# Patient Record
Sex: Male | Born: 1955
Health system: Southern US, Community
[De-identification: ages and names within clinical notes are randomized; demographics above are authoritative.]

## PROBLEM LIST (undated history)

## (undated) DIAGNOSIS — E7800 Pure hypercholesterolemia, unspecified: Secondary | ICD-10-CM

## (undated) DIAGNOSIS — K219 Gastro-esophageal reflux disease without esophagitis: Secondary | ICD-10-CM

## (undated) DIAGNOSIS — M1990 Unspecified osteoarthritis, unspecified site: Secondary | ICD-10-CM

## (undated) DIAGNOSIS — J302 Other seasonal allergic rhinitis: Secondary | ICD-10-CM

## (undated) DIAGNOSIS — T8859XA Other complications of anesthesia, initial encounter: Secondary | ICD-10-CM

## (undated) DIAGNOSIS — L0390 Cellulitis, unspecified: Secondary | ICD-10-CM

## (undated) DIAGNOSIS — Z9889 Other specified postprocedural states: Secondary | ICD-10-CM

## (undated) DIAGNOSIS — I10 Essential (primary) hypertension: Secondary | ICD-10-CM

## (undated) HISTORY — PX: CERVICAL FUSION: SHX112

## (undated) HISTORY — PX: KNEE ARTHROSCOPY: SUR90

## (undated) HISTORY — PX: SHOULDER ARTHROSCOPY: SHX128

## (undated) HISTORY — PX: OTHER SURGICAL HISTORY: SHX169

---

## 2001-01-20 ENCOUNTER — Ambulatory Visit (HOSPITAL_COMMUNITY): Admission: RE | Admit: 2001-01-20 | Discharge: 2001-01-20 | Payer: Self-pay | Admitting: General Surgery

## 2001-02-06 ENCOUNTER — Encounter: Payer: Self-pay | Admitting: Specialist

## 2001-02-06 ENCOUNTER — Ambulatory Visit (HOSPITAL_COMMUNITY): Admission: RE | Admit: 2001-02-06 | Discharge: 2001-02-06 | Payer: Self-pay | Admitting: Specialist

## 2001-03-24 ENCOUNTER — Encounter: Payer: Self-pay | Admitting: Specialist

## 2001-03-24 ENCOUNTER — Observation Stay (HOSPITAL_COMMUNITY): Admission: RE | Admit: 2001-03-24 | Discharge: 2001-03-24 | Payer: Self-pay | Admitting: Specialist

## 2003-04-23 ENCOUNTER — Encounter: Admission: RE | Admit: 2003-04-23 | Discharge: 2003-05-08 | Payer: Self-pay | Admitting: Nurse Practitioner

## 2004-03-23 ENCOUNTER — Observation Stay (HOSPITAL_COMMUNITY): Admission: AD | Admit: 2004-03-23 | Discharge: 2004-03-24 | Payer: Self-pay | Admitting: Family Medicine

## 2004-03-26 ENCOUNTER — Ambulatory Visit (HOSPITAL_COMMUNITY): Admission: RE | Admit: 2004-03-26 | Discharge: 2004-03-26 | Payer: Self-pay | Admitting: *Deleted

## 2004-03-27 ENCOUNTER — Inpatient Hospital Stay (HOSPITAL_BASED_OUTPATIENT_CLINIC_OR_DEPARTMENT_OTHER): Admission: RE | Admit: 2004-03-27 | Discharge: 2004-03-27 | Payer: Self-pay | Admitting: *Deleted

## 2004-04-10 ENCOUNTER — Ambulatory Visit: Payer: Self-pay | Admitting: *Deleted

## 2007-05-12 ENCOUNTER — Ambulatory Visit: Payer: Self-pay | Admitting: Internal Medicine

## 2007-05-12 ENCOUNTER — Ambulatory Visit (HOSPITAL_COMMUNITY): Admission: RE | Admit: 2007-05-12 | Discharge: 2007-05-12 | Payer: Self-pay | Admitting: Internal Medicine

## 2007-10-03 ENCOUNTER — Ambulatory Visit (HOSPITAL_COMMUNITY): Admission: RE | Admit: 2007-10-03 | Discharge: 2007-10-03 | Payer: Self-pay | Admitting: Family Medicine

## 2007-10-17 ENCOUNTER — Ambulatory Visit (HOSPITAL_COMMUNITY): Admission: RE | Admit: 2007-10-17 | Discharge: 2007-10-17 | Payer: Self-pay | Admitting: Family Medicine

## 2007-10-31 ENCOUNTER — Ambulatory Visit (HOSPITAL_COMMUNITY): Admission: RE | Admit: 2007-10-31 | Discharge: 2007-11-01 | Payer: Self-pay | Admitting: Neurosurgery

## 2009-12-13 IMAGING — CR DG CERVICAL SPINE COMPLETE 4+V
6 series · 6 of 6 positions shown · non-contrast
Comparison: None

CLINICAL DATA: Cervical pain

CERVICAL SPINE - COMPLETE 4+ VIEW

[view not recorded (1 of 6)]
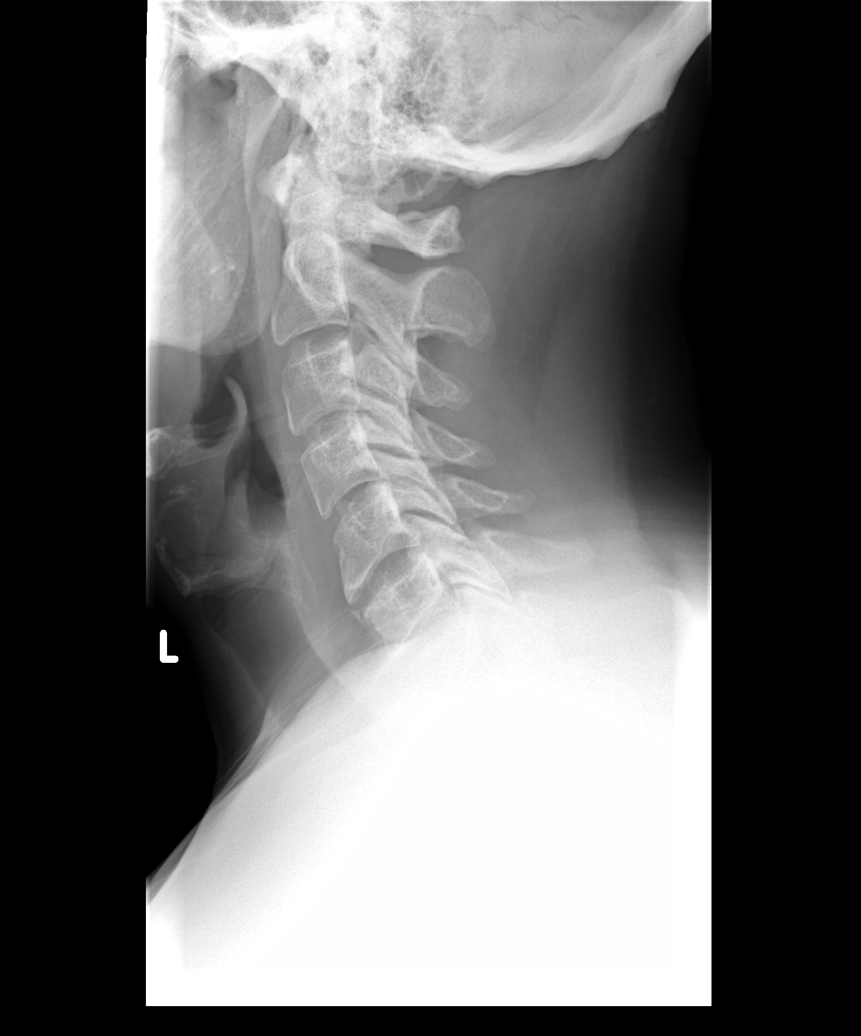

[view not recorded (2 of 6)]
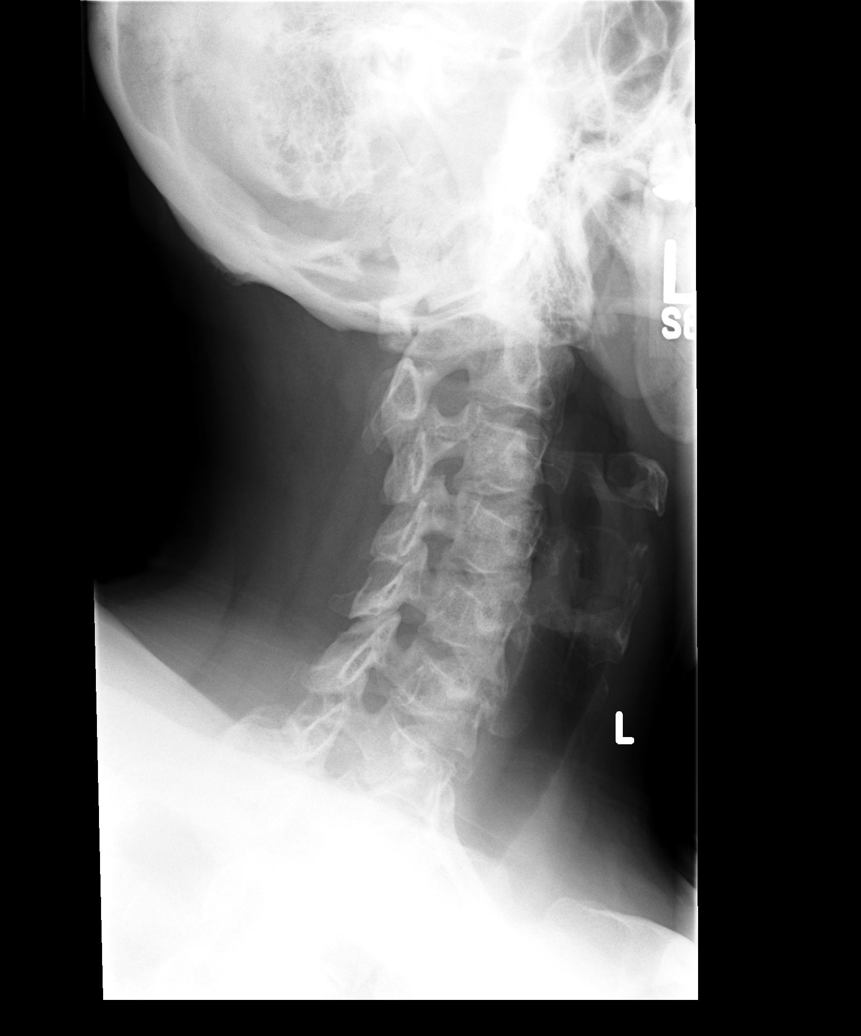

[view not recorded (3 of 6)]
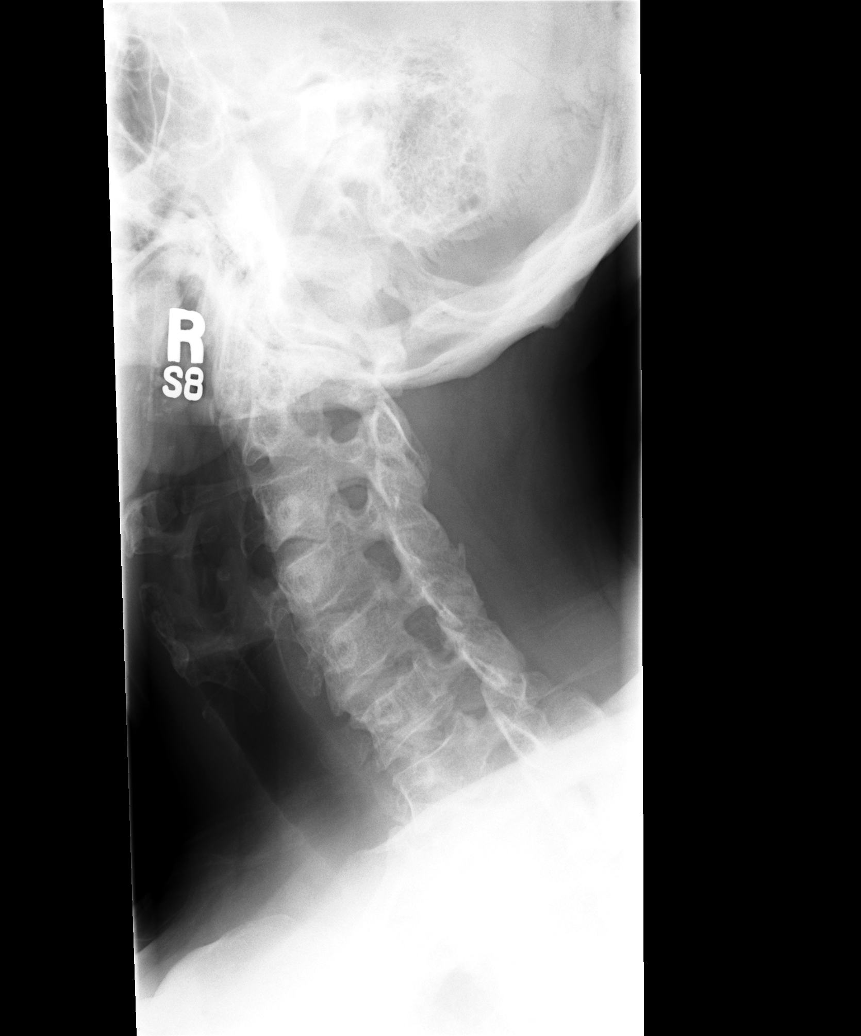

[view not recorded (4 of 6)]
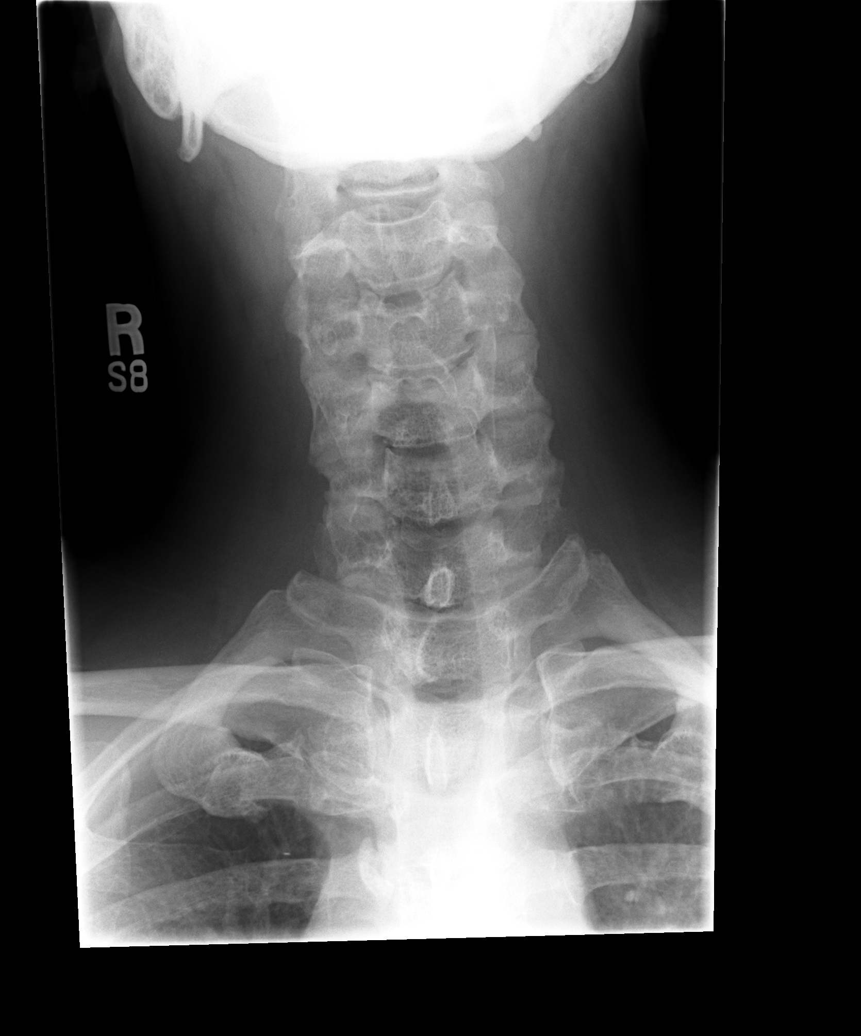

[view not recorded (5 of 6)]
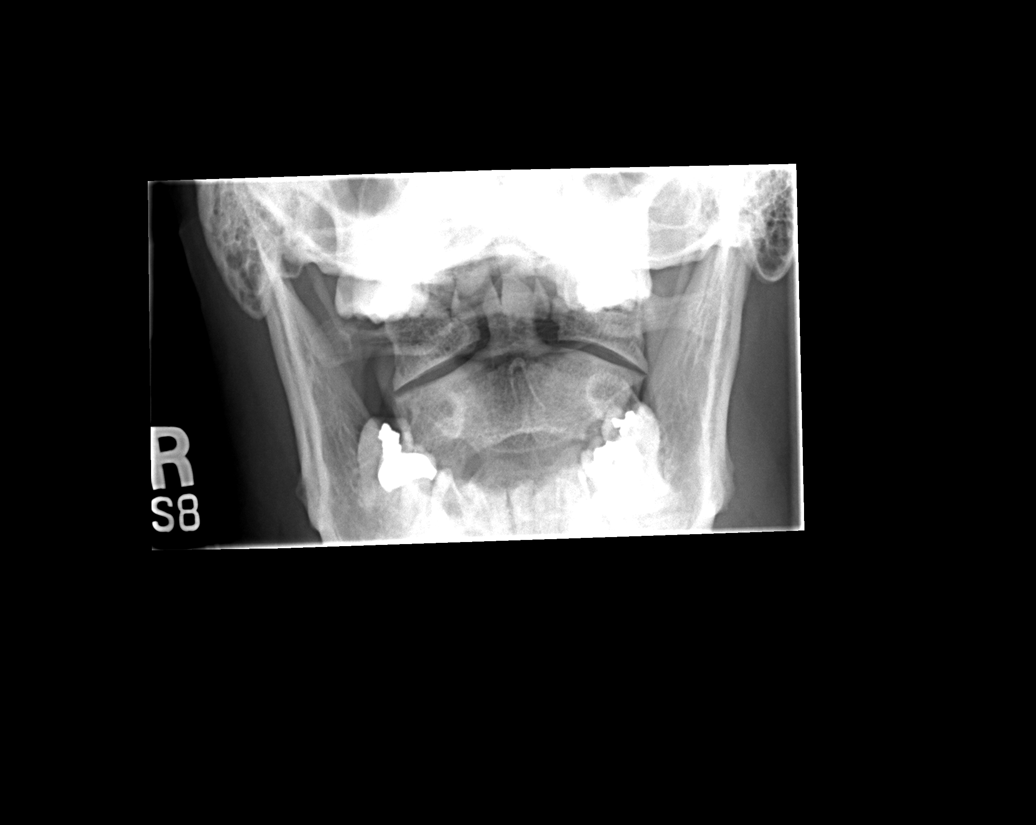

[view not recorded (6 of 6)]
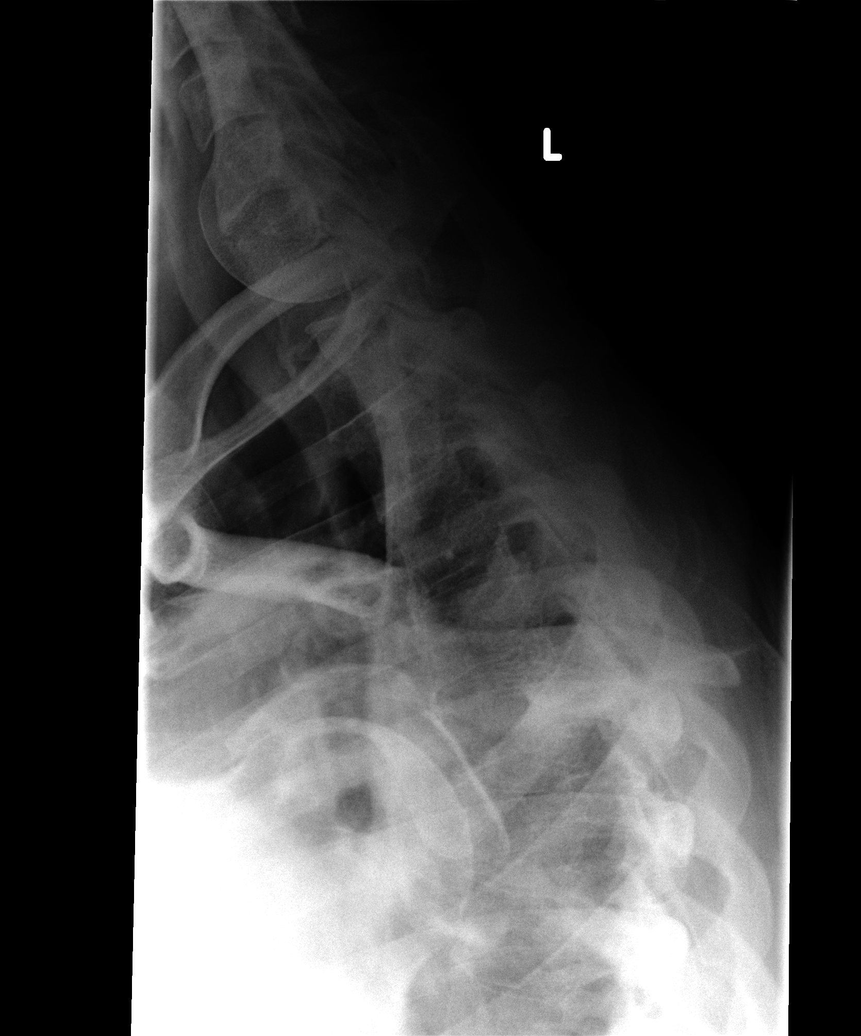

[6 of 6 positions shown; findings below may reference images not displayed]

FINDINGS: The alignment of the cervical spine is normal.

The prevertebral soft tissue space is normal.

There is disc space narrowing and mild spur formation at C5-6 and
C6-C7 consistent with degenerative disc disease.

On the left, there is neural foraminal narrowing involving the
approximate C3-4 and C5 levels.
IMPRESSION: 1.  No acute findings.
2.  Cervical spondylosis.
3.  Left-sided neural foraminal narrowing, which may account for
the patient's symptoms.  An MR of the cervical spine may provide
additional diagnostic information.

## 2010-10-13 NOTE — Op Note (Signed)
NAMEPATRIC, Andrew Manning             ACCOUNT NO.:  [id number]   MEDICAL RECORD NO.:  [id number]          PATIENT TYPE:  AMB   LOCATION:  DAY                           FACILITY:  [facility]   PHYSICIAN:  R. Roetta Sessions, M.D. DATE OF BIRTH:  1955-07-02   DATE OF PROCEDURE:  05/12/2007  DATE OF DISCHARGE:                               OPERATIVE REPORT   PROCEDURE:  Screening colonoscopy, ileocolonoscopy.   INDICATIONS FOR PROCEDURE:  A 55 year old gentleman sent over at the  courtesy of Dr. Lilyan Punt for colorectal cancer screening.  He is  devoid of any lower GI tract symptoms. He has never had his lower GI  tract imaged. There is no family history of colorectal neoplasia.  Colonoscopy is now being done as a screening maneuver.  This approach  has been discussed with the patient at length. The potential risks,  benefits and alternatives have been reviewed.  Please see documentation  in the medical record.   MONITORING:  O2 saturation, blood pressure, pulse and respirations were  monitored throughout the entire procedure.   CONSCIOUS SEDATION:  Versed 5 mg IV, Demerol 100 mg IV in divided doses.   INSTRUMENT:  Pentax video chip system.   FINDINGS:  Digital rectal exam revealed no abnormalities.   ENDOSCOPIC DIAGNOSIS:  The prep was good.   COLON:  The colonic mucosa was surveyed from the rectosigmoid junction  through the left transverse and right colon to the area of the  appendiceal orifice, ileocecal valve and cecum.  These structures were  well seen and photographed for the record.  The terminal ileum is  admitted to 15 cm easily. From this level, the scope was slowly and  cautiously withdrawn.  All previously mentioned mucosal surfaces were  again seen.  The patient had scattered sigmoid diverticulum and  colonic  mucosa, however appeared entirely normal as did the terminal ileum  mucosa. The scope was pulled down to the rectum where a thorough  examination of the rectal  mucosa including a retroflexed view of the  anal verge demonstrated no abnormalities.  The patient tolerated the  procedure well and was reacted in endoscopy.   IMPRESSION:  1. Normal rectum.  2. Sigmoid diverticulum and colonic mucosa, terminal ileum mucosa      appeared normal.   RECOMMENDATIONS:  1. Diverticulosis literature provided to Andrew Manning.  2. Consider repeat screening colonoscopy 10 years.      Jonathon Bellows, M.D.  Electronically Signed     RMR/MEDQ  D:  05/12/2007  T:  05/13/2007  Job:  [id number]   cc:   Lorin Picket A. Gerda Diss, MD  Fax: [phone]

## 2010-10-13 NOTE — Op Note (Signed)
NAME:  Andrew Manning, Andrew Manning NO.:  [id number]   MEDICAL RECORD NO.:  [id number]          PATIENT TYPE:  OIB   LOCATION:  3528                         FACILITY:  [facility]   PHYSICIAN:  Clydene Fake, M.D.  DATE OF BIRTH:  Aug 01, 1955   DATE OF PROCEDURE:  10/31/2007  DATE OF DISCHARGE:                               OPERATIVE REPORT   PREOPERATIVE DIAGNOSES:  __________   POSTOPERATIVE DIAGNOSES:  __________   REASON FOR PROCEDURE:  The patient is a 55 year old gentleman who has  been admitted with pain, numbness, and weakness.  __________ The patient  was brought in for decompression and fusion.   PROCEDURE IN DETAIL:  The patient was brought to the operating room and  general anesthesia was induced.  The patient was placed in 10-pound  Halter traction, prepped and draped in a sterile fashion.  Site of the  incision injected with 10 mL of 1% lidocaine with epinephrine.  Incision  was then made from the midline to the anterior border of the  sternocleidomastoid muscle on the left side.  Neck incision taken down  to the platysma, and hemostasis obtained with Bovie cauterization.  The  platysma was incised with the Bovie and blunt dissection taken through  the anterior cervical fascia to the anterior cervical spine.  A needle  was placed in interspace.  X-ray was obtained showing this was at C5-C6  interspace.  Disk space was incised with a #15 blade.  A partial  diskectomy was performed with pituitary rongeurs as the needle was  removed.  The longus colli muscles were reflected laterally from C5-C7  interspace and a self-retaining retractor was placed.  Disk spaces of C5-  C6 and C6-C7 were again incised with #15 blade and diskectomy started  with pituitary rongeurs and curettes.  Anterior osteophytes removed with  Kerrison punches.  Distraction pins were placed in the C5-C7, interspace  was distracted.  Microscope was brought in for microdissection at this  point and  curettes and 1- and 2-mm Kerrison punches were used to  continue the diskectomy, removing disk osteophytes and posterior  ligaments.  In this way, we decompressed the central canal and then did  bilateral foraminotomies at both the C5-C6 and C6-C7 levels, and at the  C6-C7 level, a large fragment of disk into the left side and compressing  on the nerve root, these were removed.  When we were finished, we had  good central and lateral decompression at both C5-C6 and C6-C7  bilaterally.  We placed a nerve hook over the nerve roots, and they were  well decompressed up the foramen.  Hemostasis was obtained with Gelfoam  and thrombin.  This was irrigated out.  We used high-speed drill to  remove cartilaginous endplate.  We measured the disk space to be 7 mm  and two 7-mm length allograft bones were tapped into place, countersunk  a couple of millimeters.  We checked posterior to the graft with the  nerve hook.  There was plenty of room between the bone graft and the  dura at both levels.  Distraction and distraction pins removed.  Hemostasis obtained with Gelfoam and thrombin.  Weight was removed from  the traction and the bone was firmly in place and a Trestle anterior  cervical plate was placed over the anterior cervical spine.  Two screws  were placed in C5, 2 in the C6, 2 in the C7, and these were tightened  down.  Lateral x-rays obtained showing good position of plate, screws,  bone plug at the C5 and C6 levels.  We could not see any lower due to  the shoulders.  Intraoperatively, he had good positioning of the bone  plates, screws, and bone plugs.  Retractors removed.  Hemostasis  obtained with bipolar cauterization and irrigated with antibiotic  solution.  We had good hemostasis, and the platysma was closed with 3-0  Vicryl interrupted sutures.  Subcutaneous tissue closed with the same.  Skin closed with benzoin and  Steri-Strips.  Dressing was placed.  The patient was placed in a soft   cervical collar, awoken from anesthesia and transferred to recovery room  in stable condition.           ______________________________  Clydene Fake, M.D.     JRH/MEDQ  D:  10/31/2007  T:  11/01/2007  Job:  [id number]

## 2010-10-16 NOTE — Cardiovascular Report (Signed)
NAMEBRYANT, Andrew Manning NO.:  [id number]   MEDICAL RECORD NO.:  [id number]          PATIENT TYPE:  OIB   LOCATION:  6501                         FACILITY:  [facility]   PHYSICIAN:  Carole Binning, M.D. LHCDATE OF BIRTH:  November 13, 1955   DATE OF PROCEDURE:  03/27/2004  DATE OF DISCHARGE:                              CARDIAC CATHETERIZATION   PROCEDURE PERFORMED:  Left heart catheterization with coronary angiography  and left ventriculography.   SURGEON:   INDICATIONS FOR PROCEDURE:  Mr. Carico is a 55 year old male who has had  two episodes of significant substernal chest pain.  A stress Cardiolite scan  performed in [place], [place], showed suggestion of inferior  perfusion defect with question of mild infarct with peri-infarct ischemia.  She was thus referred for cardiac catheterization.   DESCRIPTION OF PROCEDURE:  A 4 French sheath was placed in the right femoral  artery.  Coronary angiography was performed with standard Judkins 4 French  catheters.  Left ventriculography was performed with angled pigtail  catheter.  Contrast was Omnipaque.  There were no complications.   RESULTS:  Hemodynamics:  Left ventricular pressure 148/28.  Aortic pressure  148/94.  There is no aortic valve gradient.   Left ventriculogram:  Wall motion is normal.  Ejection fraction is estimated  at 60%.  There is no mitral regurgitation.   Coronary arteriography (right dominant):  1.  Left main is normal.  2.  Left anterior descending artery gives rise to a small first diagonal and      a normal sized second diagonal branch.  The LAD is normal.  3.  Left circumflex gives rise to three normal sized obtuse marginal      branches.  The left circumflex is normal.  4.  Right coronary artery is a large dominant vessel giving rise to a large      posterior descending artery, a small first posterolateral branch and a      large second posterolateral branch.  The right coronary  artery is      normal.   IMPRESSION:  1.  Normal left ventricular systolic function with moderate elevation of      left ventricular and end diastolic pressure.  2.  Normal coronary arteries.   RECOMMENDATIONS:  Medical therapy.       MWP/MEDQ  D:  03/27/2004  T:  03/27/2004  Job:  [id number]   cc:   Vida Roller, M.D.  Fax: [phone]   Scott A. Gerda Diss, MD  [address], [address]  [place]  [place] [address]  Fax: [phone]   Cardiac Cath Lab

## 2010-10-16 NOTE — Op Note (Signed)
[place]  Patient:    Andrew Manning, Andrew Manning Visit Number: [id number] MRN: [id number]          Service Type: SUR Location: 4W 0471 01 Attending Physician:  Pierce Crane Dictated by:   Javier Docker, M.D. Proc. Date: 03/24/01 Admit Date:  03/24/2001 Discharge Date: 03/24/2001                             Operative Report  PREOPERATIVE DIAGNOSIS:  Impingement syndrome left shoulder.  POSTOPERATIVE DIAGNOSIS:  Impingement syndrome left shoulder, anterior glenoid labral tear.  OPERATION:  Left shoulder arthroscopy, subacromial decompression, bursectomy, debridement of labral tear.  SURGEON:  Javier Docker, M.D.  INDICATIONS:  This is a 55 year old male with shoulder pain, positive impingement sign.  He received intermittent relief with subacromial corticosteroid injection.  She failed conservative management.  Operative management is indicated for subacromial decompression, bursectomy.  Risks and benefits have been discussed including wound infection, injury to vascular structures, no change in symptoms, need for repeat procedure.  DESCRIPTION OF PROCEDURE: The patient was placed in the supine beach chair position.  After the injection of adequate general anesthesia and 1 g of Kefzol, the left shoulder was prepped and draped in the usual sterile fashion.  A surgical marker was used to delineate the acromion, clavicle, coracoid.  An incision was made through the skin only in a standard posterior lateral portal of the acromion.  The traction was applied to the arm.  The cannula was introduced through the glenohumeral space towards the coracoid penetrating the capsule atraumatically.  The _____ was used to insufflate the joint. Examination revealed normal humeral head, glenoid and rotator cuff as well as biceps, however, there appeared to be either a Buford type complex or a significant anterior detachment of the anterior labrum.   It did not appear to be acute.  This was reduced with internal rotation and opened in external rotation.  Also, with internal rotation, there was a leading edge of the tear that evaginated between the glenoid and the humerus.  The anterior pole was then placed through the skin only.  A blunt cannula was introduced into the glenohumeral joint, shaver introduced and utilized to debride this labral tear.  It was felt that given this was atraumatic and to reattach this part of the labrum would require six or so tacks, it was felt not to proceed with repair.  He had no evidence of instability and it was mainly impingement. Following this debridement, there was no evagination of the labrum during internal and external rotation.  There was no instability noted anteriorly and posteriorly with intraoperative examination.  Next, the cannula was reintroduced into the subacromial space.  An anterior lateral portal was incised to the skin only and a cannula was introduced into the subacromial joint after a 0.25% Marcaine with epinephrine was infiltrated.  There was significant hypertrophic bursae noted and a bursectomy was performed with a 42 shaver.  There was mild lateral sloping of the anterior lateral aspect of the acromion.  The CA ligament was attached with the ArthroWand.  Full bursectomy was performed and inspection of the rotator cuff anteriorly, laterally and posteriorly revealed no evidence of a tear.  _____ was performed on the undersurface of the acromion anterior laterally to a good space following this.  Good range of motion of the shoulder without impingement.  There was no impingement beneath the clavicle noted as well.  After full bursectomy and inspection of the cuff, inspection revealed no evidence of residual impingement and no evidence of rotator cuff tear.  No active bleeding.  The CA ligament was morcellized.  Instrumentation was then removed and the portals were closed with  4-0 nylon sutures.  The wound was then dressed sterilely and placed in a sling.  The patient was extubated without difficulty and transferred to the recovery room in satisfactory condition. Dictated by:   Javier Docker, M.D. Attending Physician:  Pierce Crane DD:  03/24/01 TD:  03/27/01 Job: [id number]

## 2010-10-16 NOTE — H&P (Signed)
Andrew Manning, Andrew Manning             ACCOUNT NO.:  [id number]   MEDICAL RECORD NO.:  [id number]          PATIENT TYPE:  OBV   LOCATION:  A225                          FACILITY:  [facility]   PHYSICIAN:  Scott A. Gerda Diss, MD    DATE OF BIRTH:  Sep 12, 1955   DATE OF ADMISSION:  03/23/2004  DATE OF DISCHARGE:  LH                                HISTORY & PHYSICAL   CHIEF COMPLAINT:  Chest pressure and discomfort.   HISTORY OF PRESENT ILLNESS:  This 55 year old white male elated chest  pressure and discomfort on the left side of his chest, which radiates into  the shoulder and there is some discomfort down the arm.  He felt his skin  got a little clammy and felt a little nauseous with it.  The symptoms have  been present for the past three hours.  He denies any reflux symptoms, but  states some sharpness and soreness in the left side of the chest.  Denies  abdominal symptoms, nausea, vomiting, fever and chills.   PAST MEDICAL HISTORY:  1.  Hypertension.  2.  Low HDL.   SOCIAL HISTORY:  Does not smoke or drink.   FAMILY HISTORY:  Does have a family history of heart disease.   ALLERGIES:  None.   MEDICATIONS:  Cozaar 100 mg daily.   REVIEW OF SYSTEMS:  See per above.   PHYSICAL EXAMINATION:  GENERAL:  No acute distress.  HEENT:  TMS, nose and throat within normal limits.  NECK:  No masses.  CHEST:  Clear to auscultation.  Chest wall; moderate tenderness, left side  of chest.  HEART:  Regular.  ABDOMEN:  Abdomen is soft.  EXTREMITIES:  No edema.  GENERAL APPEARANCE:  Moderate obesity.   LABORATORY DATA:  EKG; no acute changes.   ASSESSMENT AND PLAN:  Chest pain.  This is concerning for the possibility of heart-related disease, but I think  the chest wall tenderness points in the direction of musculoskeletal.  Given  his symptoms and risk factors I do not feel this can viewed as an outpatient  problem.   1.  We will go ahead and admit him for observation.  2.  Do enzymes overnight  and if those are negative we will probably gear      towards doing a Cardiolite test on the patient on Tuesday.     Scot   SAL/MEDQ  D:  03/23/2004  T:  03/24/2004  Job:  [id number]

## 2010-10-16 NOTE — H&P (Signed)
[facility]  Patient:    Andrew Manning, Andrew Manning Visit Number: [id number] MRN: [id number]          Service Type: Attending:  Javier Docker, M.D. Dictated by:   Sammuel Cooper. Mahar, P.A. Adm. Date:  03/24/01                           History and Physical  DATE OF BIRTH:  05/17/1956  CHIEF COMPLAINT:  Left shoulder pain.  HISTORY OF PRESENT ILLNESS:  The patient is a 55 year old male who has been experiencing problems and difficulties with his left shoulder for approximately six months.  He has been seen by Dr. Jene Every.  He has failed conservative management including steroid injections, NSAIDs, and rest. MRI scan done on February 07, 2001, revealed no evidence of rotator cuff tear, mild acromioclavicular degenerative arthropathy, mild laterally downsloping acromion which can produce impingement.  Due to his failure to respond to conservative measures it was thought that the best course of action at this point would be a left shoulder arthroscopy with a subacromial decompression of possible open rotator cuff tear as needed.  The risks and benefits were discussed with the patient by Dr. Jene Every, and they decided to proceed with the surgery.  ALLERGIES:  No known drug allergies.  MEDICATIONS: 1. Cozaar 100 mg q.d. 2. Glucosamine 1000 mg b.i.d. 3. Bextra 20 mg q.d.  PAST MEDICAL HISTORY:  Hypertension.  PAST SURGICAL HISTORY: 1. Appendectomy in 1978. 2. Knee arthroscopy in 1990. 3. ______ in 1999. 4. Superficial mass removed from his back in August 2002.  SOCIAL HISTORY:  The patient denies tobacco use, denies alcohol use.  He is married and has two children, ages 28 and 83.  He does work as a Consulting civil engineer, Youth worker.  FAMILY HISTORY:  Mother is deceased secondary to cancer.  Father deceased, cancer and hypertension.  REVIEW OF SYSTEMS:  Patient denies any fevers, chills, night sweats, or bleeding tendencies.  CNS:   Denies any blurred vision, double vision, seizure, headache, paralysis.  PULMONARY:  Denies shortness of breath, productive cough, hemoptysis.  CARDIOVASCULAR:  Denies chest pain, angina, orthopnea, or claudication.  GASTROINTESTINAL:  Denies nausea, vomiting, constipation, diarrhea, melena, or bloody stool.  GENITOURINARY:  Denies dysuria, hematuria, discharge.  MUSCULOSKELETAL:  Denies any ______ , numbness, or weakness in his extremities.  PHYSICAL EXAMINATION:  VITAL SIGNS:  Blood pressure 128/88, respirations 14 and unlabored, pulse 68 and regular.  GENERAL:  [age]-year-old male who is alert and oriented.  In no acute distress.  Well-nourished, well-groomed.  Appears his stated age.  He is very pleasant and cooperative to examination.  HEENT:  Head normocephalic, atraumatic.  Pupils are equal, round, and reactive to light.  Nares patent bilaterally.  Throat is clear, without any erythema or exudate.  NECK:  Soft and supple to palpation.  No bruits appreciated.  No thyromegaly noted.  CHEST:  Clear to auscultation bilaterally.  No rales, rhonchi, wheezes, or friction rub.  BREASTS:  Not pertinent, not performed.  HEART:  S1, S2, regular rate and rhythm.  No murmurs, gallops, or rubs appreciated.  ABDOMEN:  Soft and supple to palpation.  Positive bowel sounds throughout. Nontender, nondistended.  No organomegaly noted.  GENITOURINARY:  Not pertinent, not performed.  EXTREMITIES:  The patient has positive impingement sign to the left shoulder. Decreased range of motion secondary to pain.  Sensation is intact to light touch to bilateral upper extremities.  Neurovascularly intact.  Radial pulses are strong and equal bilaterally.  SKIN:  Intact, without any lesions or rashes.  LABORATORY DATA:  MRI of the left shoulder showed as stated above.  IMPRESSION: 1. Left shoulder impingement syndrome. 2. Hypertension.  PLAN:  Admit to [facility] on March 24, 2001, for a left shoulder arthroscopy with subacromial decompression and possible open rotator cuff tear repair as needed.  This will be done by Dr. Jene Every.  The patients primary care medical physician is Dr. Lilyan Punt.  The patient has an appointment with him at 3 oclock on Monday, March 20, 2001, for medical clearance.  This will be faxed to our office and forwarded on to the hospital upon receiving it. Dictated by:   Sammuel Cooper. Mahar, P.A. Attending:  Javier Docker, M.D. DD:  03/20/01 TD:  03/21/01 Job: [id number]

## 2010-10-16 NOTE — Procedures (Signed)
NAMEKELVON, GIANNINI             ACCOUNT NO.:  [id number]   MEDICAL RECORD NO.:  [id number]          PATIENT TYPE:  OBV   LOCATION:  A225                          FACILITY:  [facility]   PHYSICIAN:  Scott A. Gerda Diss, MD    DATE OF BIRTH:  1955-10-19   DATE OF PROCEDURE:  DATE OF DISCHARGE:                                    STRESS TEST   PROCEDURE:  Stress Cardiolite   Resting EKG:  No acute ST segment changes are noted.  Normal sinus rhythm.   PROTOCOL:  Bruce protocol with Cardiolite.   INDICATIONS:  Chest discomfort.   Heart rate response __________ patient's heart rate gradually went up,  peaked at 160 at the end of stage III.   SYMPTOMATOLOGY:  Mild shortness of breath, but no chest discomfort.   ARRHYTHMIAS:  None.   BLOOD PRESSURE RESPONSE TO EXERCISE:  Normal.   ST SEGMENT CHANGES:  None.   RECOVERY PHASE:  No ST segment changes noted.  Blood pressure gradually came  down.  Heart rate came down to normal level.  No abnormal symptomatology.   INTERPRETATION:  Normal stress test.  Await Cardiolite imaging.     Scot   SAL/MEDQ  D:  03/24/2004  T:  03/24/2004  Job:  [id number]

## 2011-02-25 LAB — BASIC METABOLIC PANEL
BUN: 15
CO2: 27
Calcium: 9.2
Chloride: 104
Creatinine, Ser: 0.91
GFR calc Af Amer: >60
GFR calc non Af Amer: >60
Glucose, Bld: 94
Potassium: 3.5
Sodium: 141

## 2011-02-25 LAB — APTT: aPTT: 29

## 2011-02-25 LAB — CBC
HCT: 46.5
Hemoglobin: 16.1
MCHC: 34.5
MCV: 89.6
Platelets: 265
RBC: 5.2
RDW: 13.1
WBC: 9.4

## 2011-02-25 LAB — URINALYSIS, ROUTINE W REFLEX MICROSCOPIC
Bilirubin Urine: NEGATIVE
Glucose, UA: NEGATIVE
Hgb urine dipstick: NEGATIVE
Ketones, ur: NEGATIVE
Nitrite: NEGATIVE
Protein, ur: NEGATIVE
Specific Gravity, Urine: 1.013
Urobilinogen, UA: 0.2
pH: 7

## 2011-02-25 LAB — PROTIME-INR
INR: 1
Prothrombin Time: 13.4

## 2011-05-24 ENCOUNTER — Emergency Department (HOSPITAL_COMMUNITY): Payer: Worker's Compensation

## 2011-05-24 ENCOUNTER — Emergency Department (HOSPITAL_COMMUNITY)
Admission: EM | Admit: 2011-05-24 | Discharge: 2011-05-24 | Disposition: A | Discharge status: Home / Self Care | Payer: Worker's Compensation | Attending: Emergency Medicine | Admitting: Emergency Medicine

## 2011-05-24 ENCOUNTER — Encounter: Payer: Self-pay | Admitting: *Deleted

## 2011-05-24 DIAGNOSIS — K219 Gastro-esophageal reflux disease without esophagitis: Secondary | ICD-10-CM | POA: Insufficient documentation

## 2011-05-24 DIAGNOSIS — IMO0002 Reserved for concepts with insufficient information to code with codable children: Secondary | ICD-10-CM | POA: Insufficient documentation

## 2011-05-24 DIAGNOSIS — I10 Essential (primary) hypertension: Secondary | ICD-10-CM | POA: Insufficient documentation

## 2011-05-24 DIAGNOSIS — L03119 Cellulitis of unspecified part of limb: Secondary | ICD-10-CM | POA: Insufficient documentation

## 2011-05-24 DIAGNOSIS — Z7982 Long term (current) use of aspirin: Secondary | ICD-10-CM | POA: Insufficient documentation

## 2011-05-24 DIAGNOSIS — L02419 Cutaneous abscess of limb, unspecified: Secondary | ICD-10-CM | POA: Insufficient documentation

## 2011-05-24 DIAGNOSIS — L0390 Cellulitis, unspecified: Secondary | ICD-10-CM

## 2011-05-24 DIAGNOSIS — W010XXA Fall on same level from slipping, tripping and stumbling without subsequent striking against object, initial encounter: Secondary | ICD-10-CM | POA: Insufficient documentation

## 2011-05-24 DIAGNOSIS — Z981 Arthrodesis status: Secondary | ICD-10-CM | POA: Insufficient documentation

## 2011-05-24 DIAGNOSIS — Y9269 Other specified industrial and construction area as the place of occurrence of the external cause: Secondary | ICD-10-CM | POA: Insufficient documentation

## 2011-05-24 HISTORY — DX: Gastro-esophageal reflux disease without esophagitis: K21.9

## 2011-05-24 HISTORY — DX: Essential (primary) hypertension: I10

## 2011-05-24 MED ORDER — VANCOMYCIN HCL IN DEXTROSE 1-5 GM/200ML-% IV SOLN
1000.0000 mg | Freq: Once | INTRAVENOUS | Status: AC
  Administered 2011-05-24: 1000 mg via INTRAVENOUS
  Filled 2011-05-24: qty 200

## 2011-05-24 MED ORDER — HYDROCODONE-ACETAMINOPHEN 5-325 MG PO TABS: 1.0000 | ORAL_TABLET | ORAL | Status: AC | PRN

## 2011-05-24 MED ORDER — DOXYCYCLINE HYCLATE 100 MG PO CAPS: 100.0000 mg | ORAL_CAPSULE | Freq: Two times a day (BID) | ORAL | Status: DC

## 2011-05-24 MED ORDER — DOXYCYCLINE HYCLATE 100 MG PO TABS
100.0000 mg | ORAL_TABLET | Freq: Once | ORAL | Status: AC
  Administered 2011-05-24: 100 mg via ORAL
  Filled 2011-05-24: qty 1

## 2011-05-24 NOTE — ED Provider Notes (Signed)
Medical screening examination/treatment/procedure(s) were conducted as a shared visit with non-physician practitioner(s) and myself.  I personally evaluated the patient during the encounter  Patient with right lower extremity leg area of cellulitis several of superficial skin lacerations and abrasions along the anterior part of the shin most likely the culprit for the cellulitis. In ED we'll give 1 dose of IV vancomycin will send the patient home on doxycycline. Return if not improving in 2 days or if worse before. No concern for deep vein thrombosis classical cellulitis.    Shelda Jakes, MD 05/24/11 [phone]

## 2011-05-24 NOTE — ED Notes (Signed)
Pt injured his right knee at work on Tuesday. States that he slipped and fell. Pt has 2 abrasions to his knee. Bruising and swelling noted to right knee and lower leg.

## 2011-05-24 NOTE — ED Provider Notes (Signed)
History     CSN: [id number]  Arrival date & time 05/24/11  4259   First MD Initiated Contact with Patient 05/24/11 1045      Chief Complaint  Patient presents with  . Knee Injury    (Consider location/radiation/quality/duration/timing/severity/associated sxs/prior treatment) Patient is a 55 y.o. male presenting with knee pain. The history is provided by the patient.  Knee Pain This is a new (He hit his right knee at work 7 days ago,  causing mild abrasions,  but a large hematorma which has since greatly resolved.   He has had increased redness and pain below the knee and anterior lower leg) problem. The current episode started in the past 7 days. The problem occurs constantly. The problem has been gradually worsening. Associated symptoms include arthralgias. Pertinent negatives include no abdominal pain, chest pain, congestion, fever, headaches, joint swelling, nausea, neck pain, numbness, rash, sore throat or weakness. Associated symptoms comments: He has had increased redness and swelling of his right lower leg.. The symptoms are aggravated by walking and standing (Palpation). He has tried ice and NSAIDs for the symptoms. The treatment provided no relief.    Past Medical History  Diagnosis Date  . Hypertension   . GERD (gastroesophageal reflux disease)     Past Surgical History  Procedure Date  . Cervical fusion   . Shoulder arthroscopy   . Knee arthroscopy     History reviewed. No pertinent family history.  History  Substance Use Topics  . Smoking status: Never Smoker   . Smokeless tobacco: Not on file  . Alcohol Use: No      Review of Systems  Constitutional: Negative for fever.  HENT: Negative for congestion, sore throat and neck pain.   Eyes: Negative.   Respiratory: Negative for chest tightness and shortness of breath.   Cardiovascular: Negative for chest pain.  Gastrointestinal: Negative for nausea and abdominal pain.  Genitourinary: Negative.     Musculoskeletal: Positive for arthralgias. Negative for joint swelling.  Skin: Positive for color change. Negative for rash and wound.  Neurological: Negative for dizziness, weakness, light-headedness, numbness and headaches.  Hematological: Negative.   Psychiatric/Behavioral: Negative.     Allergies  Review of patient's allergies indicates no known allergies.  Home Medications   Current Outpatient Rx  Name Route Sig Dispense Refill  . ASPIRIN EC 81 MG PO TBEC Oral Take 81 mg by mouth daily.      . ETODOLAC 400 MG PO TABS Oral Take 400 mg by mouth 2 (two) times daily.      Marland Kitchen GLUCOSAMINE-CHONDROITIN 500-400 MG PO TABS Oral Take 1 tablet by mouth 3 (three) times daily.      Marland Kitchen LORATADINE 10 MG PO TABS Oral Take 10 mg by mouth daily.      Marland Kitchen LOSARTAN POTASSIUM 100 MG PO TABS Oral Take 100 mg by mouth daily.      Marland Kitchen PANTOPRAZOLE SODIUM 40 MG PO TBEC Oral Take 40 mg by mouth daily.      Marland Kitchen DOXYCYCLINE HYCLATE 100 MG PO CAPS Oral Take 1 capsule (100 mg total) by mouth 2 (two) times daily. 20 capsule 0  . HYDROCODONE-ACETAMINOPHEN 5-325 MG PO TABS Oral Take 1 tablet by mouth every 4 (four) hours as needed for pain. 15 tablet 0    BP 144/82  Pulse 87  Temp(Src) 98.5 F (36.9 C) (Oral)  Resp 16  Ht 5\' 8"  (1.727 m)  Wt 260 lb (117.935 kg)  BMI 39.53 kg/m2  SpO2 96%  Physical Exam  Nursing note and vitals reviewed. Constitutional: He is oriented to person, place, and time. He appears well-developed and well-nourished.  HENT:  Head: Normocephalic and atraumatic.  Eyes: Conjunctivae are normal.  Neck: Normal range of motion.  Cardiovascular: Normal rate, regular rhythm, normal heart sounds and intact distal pulses.   Pulmonary/Chest: Effort normal and breath sounds normal. He has no wheezes.  Abdominal: Soft. Bowel sounds are normal. There is no tenderness.  Musculoskeletal: Normal range of motion.  Neurological: He is alert and oriented to person, place, and time.  Skin: Skin is warm  and dry. There is erythema.       Non pitting edema of anterior right lower leg from upper tibia to ankle,  Slightly tender,  Increased erythema.  Two small abrasions without drainage or fluctuance right upper anterior tibia,  Smaller abrasion lower anterior leg as well.    Psychiatric: He has a normal mood and affect.    ED Course  Procedures (including critical care time)  Labs Reviewed - No data to display Dg Knee Complete 4 Views Right  05/24/2011  *RADIOLOGY REPORT*  Clinical Data: Injury  RIGHT KNEE - COMPLETE 4+ VIEW  Comparison: None  Findings: There is mild diffuse soft tissue swelling.  No joint effusion.  Marginal spur formation and sharpening of the tibial spines noted consistent with moderate DJD.  No fractures or subluxations identified.  IMPRESSION:  1.  No acute bone findings. 2.  Degenerative joint disease.  Original Report Authenticated By: Rosealee Albee, M.D.     1. Cellulitis    Vancomycin 1 gram IV given.  Doxycycline.    MDM  Elevation,  Warm compresses.  Doxy.  Recheck in 2 days,  Unless worse.        Candis Musa, PA 05/24/11 1319

## 2011-05-26 ENCOUNTER — Encounter (HOSPITAL_COMMUNITY): Payer: Self-pay | Admitting: Emergency Medicine

## 2011-05-26 ENCOUNTER — Emergency Department (HOSPITAL_COMMUNITY)
Admission: EM | Admit: 2011-05-26 | Discharge: 2011-05-26 | Disposition: A | Discharge status: Home / Self Care | Payer: Worker's Compensation | Attending: Emergency Medicine | Admitting: Emergency Medicine

## 2011-05-26 DIAGNOSIS — L0390 Cellulitis, unspecified: Secondary | ICD-10-CM

## 2011-05-26 DIAGNOSIS — I10 Essential (primary) hypertension: Secondary | ICD-10-CM | POA: Insufficient documentation

## 2011-05-26 DIAGNOSIS — M79609 Pain in unspecified limb: Secondary | ICD-10-CM | POA: Insufficient documentation

## 2011-05-26 DIAGNOSIS — T148XXA Other injury of unspecified body region, initial encounter: Secondary | ICD-10-CM

## 2011-05-26 DIAGNOSIS — X58XXXA Exposure to other specified factors, initial encounter: Secondary | ICD-10-CM | POA: Insufficient documentation

## 2011-05-26 DIAGNOSIS — Z79899 Other long term (current) drug therapy: Secondary | ICD-10-CM | POA: Insufficient documentation

## 2011-05-26 DIAGNOSIS — Z9889 Other specified postprocedural states: Secondary | ICD-10-CM | POA: Insufficient documentation

## 2011-05-26 DIAGNOSIS — L03119 Cellulitis of unspecified part of limb: Secondary | ICD-10-CM | POA: Insufficient documentation

## 2011-05-26 DIAGNOSIS — Z7982 Long term (current) use of aspirin: Secondary | ICD-10-CM | POA: Insufficient documentation

## 2011-05-26 DIAGNOSIS — K219 Gastro-esophageal reflux disease without esophagitis: Secondary | ICD-10-CM | POA: Insufficient documentation

## 2011-05-26 DIAGNOSIS — R609 Edema, unspecified: Secondary | ICD-10-CM | POA: Insufficient documentation

## 2011-05-26 DIAGNOSIS — L02419 Cutaneous abscess of limb, unspecified: Secondary | ICD-10-CM | POA: Insufficient documentation

## 2011-05-26 HISTORY — DX: Cellulitis, unspecified: L03.90

## 2011-05-26 MED ORDER — VANCOMYCIN HCL IN DEXTROSE 1-5 GM/200ML-% IV SOLN: 1000.0000 mg | Freq: Once | INTRAVENOUS | Status: DC

## 2011-05-26 MED ORDER — VANCOMYCIN HCL IN DEXTROSE 1-5 GM/200ML-% IV SOLN
1000.0000 mg | Freq: Once | INTRAVENOUS | Status: AC
  Administered 2011-05-26: 1000 mg via INTRAVENOUS

## 2011-05-26 MED ORDER — VANCOMYCIN HCL IN DEXTROSE 1-5 GM/200ML-% IV SOLN
INTRAVENOUS | Status: AC
  Filled 2011-05-26: qty 200

## 2011-05-26 NOTE — ED Notes (Signed)
Patient c/o right leg pain. Patient treated here on 05/24/11 for cellulitis of right leg after being injured at work. Patient received 1 gram of vancomycin IV while here and given prescription for doxycycline. Patient redness and swelling getting worse. Patient now has bruising to right heel.

## 2011-05-26 NOTE — ED Provider Notes (Addendum)
This chart was scribed for [employer]. Colon Branch, MD by Williemae Natter. The patient was seen in room APA12/APA12 and the patient's care was started at 1:44 PM.  CSN: [id number]  Arrival date & time 05/26/11  1056   First MD Initiated Contact with Patient 05/26/11 1159      Chief Complaint  Patient presents with  . Leg Pain  . Recurrent Skin Infections    (Consider location/radiation/quality/duration/timing/severity/associated sxs/prior treatment) HPI Seen at 1:44 PM Andrew Manning is a 55 y.o. male who presents to the Emergency Department complaining of continued cellulitis in the right leg. Pt was injured at work  05/18/11 and developed a cellulitis. Treated in the ER 05/24/11 with vancomycin and sent home on doxycycline. Cellulitis  is worsening and spreading to a larger area of the lower right leg. Pt also reported bruising on the right heel that occurred at the time of the other injury as well.   PCP Dr. Gerda Diss Past Medical History  Diagnosis Date  . Hypertension   . GERD (gastroesophageal reflux disease)   . Cellulitis     Past Surgical History  Procedure Date  . Cervical fusion   . Shoulder arthroscopy   . Knee arthroscopy     Family History  Problem Relation Age of Onset  . Cancer Mother   . Cancer Father   . Hypertension Father   . Hypertension Sister   . Hypertension Brother     History  Substance Use Topics  . Smoking status: Never Smoker   . Smokeless tobacco: Never Used  . Alcohol Use: No      Review of Systems 10 Systems reviewed and are negative for acute change except as noted in the HPI.  Allergies  Review of patient's allergies indicates no known allergies.  Home Medications   Current Outpatient Rx  Name Route Sig Dispense Refill  . ASPIRIN EC 81 MG PO TBEC Oral Take 81 mg by mouth daily.      Marland Kitchen DOXYCYCLINE HYCLATE 100 MG PO CAPS Oral Take 1 capsule (100 mg total) by mouth 2 (two) times daily. 20 capsule 0  . ETODOLAC 400 MG PO TABS Oral  Take 400 mg by mouth 2 (two) times daily.      Marland Kitchen GLUCOSAMINE-CHONDROITIN 500-400 MG PO TABS Oral Take 1 tablet by mouth 3 (three) times daily.      Marland Kitchen HYDROCODONE-ACETAMINOPHEN 5-325 MG PO TABS Oral Take 1 tablet by mouth every 4 (four) hours as needed for pain. 15 tablet 0  . LORATADINE 10 MG PO TABS Oral Take 10 mg by mouth daily.      Marland Kitchen LOSARTAN POTASSIUM 100 MG PO TABS Oral Take 100 mg by mouth daily.      Marland Kitchen PANTOPRAZOLE SODIUM 40 MG PO TBEC Oral Take 40 mg by mouth daily.        BP 146/88  Pulse 75  Temp(Src) 97.7 F (36.5 C) (Oral)  Resp 16  Ht 5\' 8"  (1.727 m)  Wt 260 lb (117.935 kg)  BMI 39.53 kg/m2  SpO2 100%  Physical Exam  Nursing note and vitals reviewed. Constitutional: He is oriented to person, place, and time. He appears well-developed and well-nourished. No distress.  HENT:  Head: Normocephalic and atraumatic.  Eyes: Conjunctivae and EOM are normal. Pupils are equal, round, and reactive to light.  Neck: Normal range of motion. Neck supple.  Cardiovascular: Normal rate, regular rhythm and normal heart sounds.   Pulmonary/Chest: Effort normal and breath sounds normal.  Abdominal: Soft.  Bowel sounds are normal.  Musculoskeletal: Normal range of motion. He exhibits edema.       Right leg is larger than left   Neurological: He is alert and oriented to person, place, and time.  Skin: Skin is warm and dry.       Cellulitis extends from knee to ankle Evidence of new cellulitis extending around to the sides of the calf 3 healed abrasions that are scabbed  Psychiatric: He has a normal mood and affect. His behavior is normal.    ED Course  Procedures (including critical care time)  MDM  Patient with recent fall at work resulting in development of cellulitis to right leg. Failed oral antibiotics. Given vancomycin i the ER and arrangements made for OP IV antibiotics for 7 days. Follow up with his PCP. Pt stable in ED with no significant deterioration in condition.The  patient appears reasonably screened and/or stabilized for discharge and I doubt any other medical condition or other [facility] requiring further screening, evaluation, or treatment in the ED at this time prior to discharge.  I personally performed the services described in this documentation, which was scribed in my presence. The recorded information has been reviewed and considered.   MDM Reviewed: previous chart, nursing note and vitals         Nicoletta Dress. Colon Branch, MD 05/26/11 1526  Nicoletta Dress. Colon Branch, MD 05/26/11 1536

## 2011-05-26 NOTE — ED Notes (Signed)
PIV to left hand left in due instructed [facility] instructions, iv flushed with saline without difficulty, site wnl and dsg intact

## 2011-05-26 NOTE — ED Notes (Signed)
I spoke with Marcelino Duster from the [facility] to give an update on the pt; pt is to get a dose of Vancomycin here in ED and 1GM for the next 7 days; order has been faxed to the specialty [facility] at [phone]

## 2011-05-26 NOTE — ED Notes (Signed)
Patient here for cellulitis on 12/24, states that it has gotten worse and more painful

## 2011-05-27 ENCOUNTER — Encounter (HOSPITAL_COMMUNITY): Payer: Worker's Compensation | Attending: Emergency Medicine

## 2011-05-27 DIAGNOSIS — L0390 Cellulitis, unspecified: Secondary | ICD-10-CM

## 2011-05-27 DIAGNOSIS — L02419 Cutaneous abscess of limb, unspecified: Secondary | ICD-10-CM | POA: Insufficient documentation

## 2011-05-27 DIAGNOSIS — L03119 Cellulitis of unspecified part of limb: Secondary | ICD-10-CM | POA: Insufficient documentation

## 2011-05-27 MED ORDER — HEPARIN SOD (PORK) LOCK FLUSH 100 UNIT/ML IV SOLN
500.0000 [IU] | Freq: Once | INTRAVENOUS | Status: AC
  Administered 2011-05-27: 300 [IU] via INTRAVENOUS

## 2011-05-27 MED ORDER — SODIUM CHLORIDE 0.9 % IV SOLN
INTRAVENOUS | Status: DC
  Administered 2011-05-27: 10:00:00 via INTRAVENOUS

## 2011-05-27 MED ORDER — SODIUM CHLORIDE 0.9 % IJ SOLN
10.0000 mL | INTRAMUSCULAR | Status: DC | PRN
  Administered 2011-05-27: 10 mL via INTRAVENOUS

## 2011-05-27 MED ORDER — VANCOMYCIN HCL IN DEXTROSE 1-5 GM/200ML-% IV SOLN
1000.0000 mg | Freq: Once | INTRAVENOUS | Status: AC
  Administered 2011-05-27: 1000 mg via INTRAVENOUS
  Filled 2011-05-27: qty 200

## 2011-05-27 NOTE — Progress Notes (Signed)
Infusion complete, patient tolerated well.  IV flushed with 10ml NS and 300 units Heparin and wrapped in guaze.

## 2011-05-28 ENCOUNTER — Encounter (HOSPITAL_COMMUNITY): Payer: Worker's Compensation

## 2011-05-28 VITALS — BP 156/89 | HR 89 | Temp 97.9°F

## 2011-05-28 DIAGNOSIS — L0390 Cellulitis, unspecified: Secondary | ICD-10-CM

## 2011-05-28 MED ORDER — VANCOMYCIN HCL IN DEXTROSE 1-5 GM/200ML-% IV SOLN
1000.0000 mg | INTRAVENOUS | Status: DC
  Administered 2011-05-28: 1000 mg via INTRAVENOUS
  Filled 2011-05-28 (×3): qty 200

## 2011-05-28 MED ORDER — HEPARIN SOD (PORK) LOCK FLUSH 100 UNIT/ML IV SOLN
500.0000 [IU] | Freq: Once | INTRAVENOUS | Status: AC
  Administered 2011-05-28: 200 [IU] via INTRAVENOUS

## 2011-05-28 MED ORDER — SODIUM CHLORIDE 0.9 % IV SOLN
INTRAVENOUS | Status: DC
  Administered 2011-05-28: 11:00:00 via INTRAVENOUS

## 2011-05-28 MED ORDER — SODIUM CHLORIDE 0.9 % IJ SOLN
10.0000 mL | INTRAMUSCULAR | Status: DC | PRN
  Administered 2011-05-28: 10 mL via INTRAVENOUS

## 2011-05-28 NOTE — Progress Notes (Signed)
Tolerated infusion well. 

## 2011-05-29 ENCOUNTER — Encounter (HOSPITAL_COMMUNITY): Payer: Worker's Compensation

## 2011-05-29 DIAGNOSIS — L0390 Cellulitis, unspecified: Secondary | ICD-10-CM

## 2011-05-29 MED ORDER — SODIUM CHLORIDE 0.9 % IJ SOLN
10.0000 mL | INTRAMUSCULAR | Status: DC | PRN
  Administered 2011-05-29: 10 mL via INTRAVENOUS

## 2011-05-29 MED ORDER — SODIUM CHLORIDE 0.9 % IV SOLN
INTRAVENOUS | Status: DC
  Administered 2011-05-29: 08:00:00 via INTRAVENOUS

## 2011-05-29 MED ORDER — HEPARIN SOD (PORK) LOCK FLUSH 100 UNIT/ML IV SOLN: 500.0000 [IU] | Freq: Once | INTRAVENOUS | Status: DC

## 2011-05-29 MED ORDER — VANCOMYCIN HCL IN DEXTROSE 1-5 GM/200ML-% IV SOLN
1000.0000 mg | Freq: Once | INTRAVENOUS | Status: AC
  Administered 2011-05-29: 1000 mg via INTRAVENOUS

## 2011-05-29 NOTE — Progress Notes (Signed)
Infusion complete, patient tolerated well.   

## 2011-05-30 ENCOUNTER — Encounter (HOSPITAL_COMMUNITY): Payer: Worker's Compensation

## 2011-05-30 DIAGNOSIS — L0390 Cellulitis, unspecified: Secondary | ICD-10-CM

## 2011-05-30 MED ORDER — SODIUM CHLORIDE 0.9 % IJ SOLN
10.0000 mL | INTRAMUSCULAR | Status: DC | PRN
  Administered 2011-05-30 (×2): 10 mL via INTRAVENOUS

## 2011-05-30 MED ORDER — VANCOMYCIN HCL IN DEXTROSE 1-5 GM/200ML-% IV SOLN
1000.0000 mg | Freq: Once | INTRAVENOUS | Status: AC
  Administered 2011-05-30: 1000 mg via INTRAVENOUS

## 2011-05-30 MED ORDER — SODIUM CHLORIDE 0.9 % IV SOLN
INTRAVENOUS | Status: DC
  Administered 2011-05-30: 08:00:00 via INTRAVENOUS

## 2011-05-30 NOTE — Progress Notes (Signed)
Infusion complete, patient tolerated well.   

## 2011-05-31 ENCOUNTER — Encounter (HOSPITAL_COMMUNITY): Payer: Worker's Compensation

## 2011-05-31 ENCOUNTER — Encounter (HOSPITAL_BASED_OUTPATIENT_CLINIC_OR_DEPARTMENT_OTHER): Payer: Worker's Compensation

## 2011-05-31 VITALS — BP 153/92 | HR 72 | Temp 97.8°F

## 2011-05-31 DIAGNOSIS — L0390 Cellulitis, unspecified: Secondary | ICD-10-CM

## 2011-05-31 DIAGNOSIS — L0291 Cutaneous abscess, unspecified: Secondary | ICD-10-CM

## 2011-05-31 LAB — VANCOMYCIN, TROUGH: Vancomycin Tr: <5 ug/mL — ABNORMAL LOW (ref 10.0–20.0)

## 2011-05-31 MED ORDER — SODIUM CHLORIDE 0.9 % IJ SOLN
10.0000 mL | INTRAMUSCULAR | Status: DC | PRN
  Administered 2011-05-31: 10 mL via INTRAVENOUS

## 2011-05-31 MED ORDER — SODIUM CHLORIDE 0.9 % IJ SOLN
INTRAMUSCULAR | Status: AC
  Filled 2011-05-31: qty 10

## 2011-05-31 MED ORDER — SODIUM CHLORIDE 0.9 % IV SOLN
INTRAVENOUS | Status: DC
  Administered 2011-05-31: 09:00:00 via INTRAVENOUS

## 2011-05-31 MED ORDER — VANCOMYCIN HCL IN DEXTROSE 1-5 GM/200ML-% IV SOLN
1000.0000 mg | Freq: Once | INTRAVENOUS | Status: AC
  Administered 2011-05-31: 1000 mg via INTRAVENOUS
  Filled 2011-05-31: qty 200

## 2011-05-31 MED ORDER — HEPARIN SOD (PORK) LOCK FLUSH 100 UNIT/ML IV SOLN: 500.0000 [IU] | Freq: Once | INTRAVENOUS | Status: DC

## 2011-05-31 NOTE — Progress Notes (Signed)
Tolerated well

## 2011-05-31 NOTE — Progress Notes (Signed)
Labs drawn today for Vanc. trough

## 2011-06-01 ENCOUNTER — Encounter (HOSPITAL_COMMUNITY): Payer: Worker's Compensation | Attending: Emergency Medicine

## 2011-06-01 VITALS — BP 152/91 | HR 63 | Temp 98.5°F

## 2011-06-01 DIAGNOSIS — L0291 Cutaneous abscess, unspecified: Secondary | ICD-10-CM | POA: Insufficient documentation

## 2011-06-01 DIAGNOSIS — L0390 Cellulitis, unspecified: Secondary | ICD-10-CM

## 2011-06-01 MED ORDER — SODIUM CHLORIDE 0.9 % IV SOLN
Freq: Once | INTRAVENOUS | Status: AC
  Administered 2011-06-01: 500 mL via INTRAVENOUS

## 2011-06-01 MED ORDER — VANCOMYCIN HCL IN DEXTROSE 1-5 GM/200ML-% IV SOLN
1000.0000 mg | Freq: Once | INTRAVENOUS | Status: AC
  Administered 2011-06-01: 1000 mg via INTRAVENOUS
  Filled 2011-06-01: qty 200

## 2011-06-01 MED ORDER — HEPARIN SOD (PORK) LOCK FLUSH 100 UNIT/ML IV SOLN
200.0000 [IU] | Freq: Once | INTRAVENOUS | Status: AC
  Administered 2011-06-01: 200 [IU] via INTRAVENOUS

## 2011-06-01 MED ORDER — SODIUM CHLORIDE 0.9 % IJ SOLN
10.0000 mL | Freq: Once | INTRAMUSCULAR | Status: AC
  Administered 2011-06-01: 10 mL via INTRAVENOUS

## 2011-06-01 NOTE — Progress Notes (Signed)
Tolerated infusion well. Discussed lab findings of vancomycin trough and need for him to see Dr. Fonnie Birkenhead further treatment. He is trying to get in touch with his contact person at work to find out who he should see because of workmans comp.

## 2011-06-02 ENCOUNTER — Encounter (HOSPITAL_COMMUNITY): Payer: Worker's Compensation

## 2011-06-02 ENCOUNTER — Encounter (HOSPITAL_COMMUNITY): Payer: Self-pay | Admitting: *Deleted

## 2011-06-02 ENCOUNTER — Emergency Department (HOSPITAL_COMMUNITY)
Admission: EM | Admit: 2011-06-02 | Discharge: 2011-06-02 | Disposition: A | Discharge status: Home / Self Care | Payer: Worker's Compensation | Attending: Emergency Medicine | Admitting: Emergency Medicine

## 2011-06-02 DIAGNOSIS — L0390 Cellulitis, unspecified: Secondary | ICD-10-CM

## 2011-06-02 DIAGNOSIS — Z79899 Other long term (current) drug therapy: Secondary | ICD-10-CM | POA: Insufficient documentation

## 2011-06-02 DIAGNOSIS — M7989 Other specified soft tissue disorders: Secondary | ICD-10-CM | POA: Insufficient documentation

## 2011-06-02 DIAGNOSIS — I10 Essential (primary) hypertension: Secondary | ICD-10-CM | POA: Insufficient documentation

## 2011-06-02 DIAGNOSIS — K219 Gastro-esophageal reflux disease without esophagitis: Secondary | ICD-10-CM | POA: Insufficient documentation

## 2011-06-02 DIAGNOSIS — Z7982 Long term (current) use of aspirin: Secondary | ICD-10-CM | POA: Insufficient documentation

## 2011-06-02 DIAGNOSIS — L03119 Cellulitis of unspecified part of limb: Secondary | ICD-10-CM | POA: Insufficient documentation

## 2011-06-02 DIAGNOSIS — L02419 Cutaneous abscess of limb, unspecified: Secondary | ICD-10-CM | POA: Insufficient documentation

## 2011-06-02 MED ORDER — SODIUM CHLORIDE 0.9 % IV SOLN
Freq: Once | INTRAVENOUS | Status: AC
  Administered 2011-06-02: 11:00:00 via INTRAVENOUS

## 2011-06-02 MED ORDER — SODIUM CHLORIDE 0.9 % IJ SOLN
INTRAMUSCULAR | Status: AC
  Filled 2011-06-02: qty 10

## 2011-06-02 MED ORDER — VANCOMYCIN HCL IN DEXTROSE 1-5 GM/200ML-% IV SOLN
1000.0000 mg | Freq: Once | INTRAVENOUS | Status: AC
  Administered 2011-06-02: 1000 mg via INTRAVENOUS
  Filled 2011-06-02: qty 200

## 2011-06-02 MED ORDER — SODIUM CHLORIDE 0.9 % IJ SOLN
10.0000 mL | Freq: Once | INTRAMUSCULAR | Status: AC
  Administered 2011-06-02: 10 mL via INTRAVENOUS

## 2011-06-02 NOTE — Progress Notes (Signed)
Tolerated well. To follow up with ED DR. As instructed per pt's work place.

## 2011-06-02 NOTE — ED Notes (Signed)
Pt states he was seen in ER on 05/24/11 for a right leg injury - been receiving IV vancomycin.  Concerned his right leg swelling/redness is worsening.

## 2011-06-02 NOTE — ED Provider Notes (Signed)
Scribed for [employer]. Colon Branch, MD, the patient was seen in room APA08/APA08 . This chart was scribed by Ellie Lunch.   CSN: [id number]  Arrival date & time 06/02/11  1204   First MD Initiated Contact with Patient 06/02/11 1511      Chief Complaint  Patient presents with  . Cellulitis recheck     (Consider location/radiation/quality/duration/timing/severity/associated sxs/prior treatment) HPI Pt seen at 3:21 PM Andrew Manning is a 56 y.o. male who presents to the Emergency Department for wound recheck. Pt was seen in ED 05/24/2011 for right leg injury. Pt was Dx with cellulitis and was treated with IV vancomycin. Pt reports improvement in swelling and pain with IV vancomycin, but redness has worsened. Pt denies any difficulty walking. Pt denies any pain to back of calf. Additionally Pt request a release to return to work. (Injury is work related injury.)    Past Medical History  Diagnosis Date  . Hypertension   . GERD (gastroesophageal reflux disease)   . Cellulitis     Past Surgical History  Procedure Date  . Cervical fusion   . Shoulder arthroscopy   . Knee arthroscopy     Family History  Problem Relation Age of Onset  . Cancer Mother   . Cancer Father   . Hypertension Father   . Hypertension Sister   . Hypertension Brother     History  Substance Use Topics  . Smoking status: Never Smoker   . Smokeless tobacco: Never Used  . Alcohol Use: No      Review of Systems 10 Systems reviewed and are negative for acute change except as noted in the HPI.   Allergies  Review of patient's allergies indicates no known allergies.  Home Medications   Current Outpatient Rx  Name Route Sig Dispense Refill  . ASPIRIN EC 81 MG PO TBEC Oral Take 81 mg by mouth daily.      . ETODOLAC 400 MG PO TABS Oral Take 400 mg by mouth 2 (two) times daily.      Marland Kitchen FLUORIDEX SENSITIVITY RELIEF 1.1-5 % DT PSTE Oral Take 1 application by mouth Daily.    Marland Kitchen GLUCOSAMINE-CHONDROITIN  500-400 MG PO TABS Oral Take 1 tablet by mouth 3 (three) times daily.      Marland Kitchen HYDROCODONE-ACETAMINOPHEN 5-325 MG PO TABS Oral Take 1 tablet by mouth every 4 (four) hours as needed for pain. 15 tablet 0  . LORATADINE 10 MG PO TABS Oral Take 10 mg by mouth daily.      Marland Kitchen LOSARTAN POTASSIUM 100 MG PO TABS Oral Take 100 mg by mouth daily.      Marland Kitchen PANTOPRAZOLE SODIUM 40 MG PO TBEC Oral Take 40 mg by mouth daily.        BP 154/96  Pulse 73  Temp(Src) 97.9 F (36.6 C) (Oral)  Resp 16  Ht 5\' 9"  (1.753 m)  Wt 260 lb (117.935 kg)  BMI 38.40 kg/m2  SpO2 98%  Physical Exam  Nursing note and vitals reviewed. Constitutional: He appears well-developed and well-nourished. No distress.  Skin:       Right lower leg with resolving cellulitis. Pulses present. No signs of renewed infection. Calf remains swollen but negative holman's and negative to palpation.     ED Course  Procedures (including critical care time) DIAGNOSTIC STUDIES: Oxygen Saturation is 98% on room air, normal by my interpretation.    COORDINATION OF CARE:  3:26 PM EDP discussed with PT that cellulitis looks improved. Pt can expect  cellulitis to darken as it is healing. Return precautions include increased swelling and new pink areas. EDP and PT agree to have work clearance for Monday 1/72013.   No diagnosis found.    MDM  Patient here for follow up. Cellulitis of right lower leg s/p 7 days of vancomycin therapy. Healing cellulitis. No further antibiotic needed. Released for work effective 06/07/11.  I personally performed the services described in this documentation, which was scribed in my presence. The recorded information has been reviewed and considered.   MDM Reviewed: nursing note and vitals        Nicoletta Dress. Colon Branch, MD 06/02/11 1539

## 2013-06-05 ENCOUNTER — Encounter: Payer: Self-pay | Admitting: Family Medicine

## 2013-06-05 ENCOUNTER — Ambulatory Visit (INDEPENDENT_AMBULATORY_CARE_PROVIDER_SITE_OTHER): Payer: BC Managed Care – PPO | Admitting: Family Medicine

## 2013-06-05 VITALS — BP 132/90 | Ht 69.0 in | Wt 272.0 lb

## 2013-06-05 DIAGNOSIS — E785 Hyperlipidemia, unspecified: Secondary | ICD-10-CM

## 2013-06-05 DIAGNOSIS — Z0000 Encounter for general adult medical examination without abnormal findings: Secondary | ICD-10-CM

## 2013-06-05 DIAGNOSIS — I10 Essential (primary) hypertension: Secondary | ICD-10-CM

## 2013-06-05 DIAGNOSIS — R7309 Other abnormal glucose: Secondary | ICD-10-CM

## 2013-06-05 DIAGNOSIS — R739 Hyperglycemia, unspecified: Secondary | ICD-10-CM | POA: Insufficient documentation

## 2013-06-05 MED ORDER — PANTOPRAZOLE SODIUM 40 MG PO TBEC: 40.0000 mg | DELAYED_RELEASE_TABLET | Freq: Every day | ORAL | Status: DC

## 2013-06-05 MED ORDER — LOSARTAN POTASSIUM-HCTZ 100-25 MG PO TABS: 1.0000 | ORAL_TABLET | Freq: Every day | ORAL | Status: DC

## 2013-06-05 NOTE — Progress Notes (Signed)
   Subjective:    Patient ID: Andrew Manning, male    DOB: 1955/12/04, 58 y.o.   MRN: [id number]  HPI Blood pressure check up. No concerns.  The patient comes in today for a wellness visit.  A review of their health history was completed.  A review of medications was also completed. Any necessary refills were discussed. Sensible healthy diet was discussed. Importance of minimizing excessive salt and carbohydrates was also discussed. Safety was stressed including driving, activities at work and at home where applicable. Importance of regular physical activity for overall health was discussed. Preventative measures appropriate for age were discussed. Time was spent with the patient discussing any concerns they have about their well-being. Patient denies any problems currently he states overall he is doing fine.   Review of Systems  Constitutional: Negative for fever, activity change and appetite change.  HENT: Negative for congestion and rhinorrhea.   Eyes: Negative for discharge.  Respiratory: Negative for cough and wheezing.   Cardiovascular: Negative for chest pain.  Gastrointestinal: Negative for vomiting, abdominal pain and blood in stool.  Genitourinary: Negative for frequency and difficulty urinating.  Musculoskeletal: Negative for neck pain.  Skin: Negative for rash.  Allergic/Immunologic: Negative for environmental allergies and food allergies.  Neurological: Negative for weakness and headaches.  Psychiatric/Behavioral: Negative for agitation.       Objective:   Physical Exam  Constitutional: He appears well-developed and well-nourished.  HENT:  Head: Normocephalic and atraumatic.  Right Ear: External ear normal.  Left Ear: External ear normal.  Nose: Nose normal.  Mouth/Throat: Oropharynx is clear and moist.  Eyes: EOM are normal. Pupils are equal, round, and reactive to light.  Neck: Normal range of motion. Neck supple. No thyromegaly present.  Cardiovascular:  Normal rate, regular rhythm and normal heart sounds.   No murmur heard. Pulmonary/Chest: Effort normal and breath sounds normal. No respiratory distress. He has no wheezes.  Abdominal: Soft. Bowel sounds are normal. He exhibits no distension and no mass. There is no tenderness.  Genitourinary: Penis normal.  Musculoskeletal: Normal range of motion. He exhibits no edema.  Lymphadenopathy:    He has no cervical adenopathy.  Neurological: He is alert. He exhibits normal muscle tone.  Skin: Skin is warm and dry. No erythema.  Psychiatric: He has a normal mood and affect. His behavior is normal. Judgment normal.   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 Hypertension-continue current medication. Increase Hyzaar use 100 mg/25 mg

## 2013-06-05 NOTE — Patient Instructions (Signed)
DASH Diet  The DASH diet stands for "Dietary Approaches to Stop Hypertension." It is a healthy eating plan that has been shown to reduce high blood pressure (hypertension) in as little as 14 days, while also possibly providing other significant health benefits. These other health benefits include reducing the risk of breast cancer after menopause and reducing the risk of type 2 diabetes, heart disease, colon cancer, and stroke. Health benefits also include weight loss and slowing kidney failure in patients with chronic kidney disease.   DIET GUIDELINES  · Limit salt (sodium). Your diet should contain less than 1500 mg of sodium daily.  · Limit refined or processed carbohydrates. Your diet should include mostly whole grains. Desserts and added sugars should be used sparingly.  · Include small amounts of heart-healthy fats. These types of fats include nuts, oils, and tub margarine. Limit saturated and trans fats. These fats have been shown to be harmful in the body.  CHOOSING FOODS   The following food groups are based on a 2000 calorie diet. See your Registered Dietitian for individual calorie needs.  Grains and Grain Products (6 to 8 servings daily)  · Eat More Often: Whole-wheat bread, brown rice, whole-grain or wheat pasta, quinoa, popcorn without added fat or salt (air popped).  · Eat Less Often: White bread, white pasta, white rice, cornbread.  Vegetables (4 to 5 servings daily)  · Eat More Often: Fresh, frozen, and canned vegetables. Vegetables may be raw, steamed, roasted, or grilled with a minimal amount of fat.  · Eat Less Often/Avoid: Creamed or fried vegetables. Vegetables in a cheese sauce.  Fruit (4 to 5 servings daily)  · Eat More Often: All fresh, canned (in natural juice), or frozen fruits. Dried fruits without added sugar. One hundred percent fruit juice (½ cup [237 mL] daily).  · Eat Less Often: Dried fruits with added sugar. Canned fruit in light or heavy syrup.  Lean Meats, Fish, and Poultry (2  servings or less daily. One serving is 3 to 4 oz [85-114 g]).  · Eat More Often: Ninety percent or leaner ground beef, tenderloin, sirloin. Round cuts of beef, chicken breast, turkey breast. All fish. Grill, bake, or broil your meat. Nothing should be fried.  · Eat Less Often/Avoid: Fatty cuts of meat, turkey, or chicken leg, thigh, or wing. Fried cuts of meat or fish.  Dairy (2 to 3 servings)  · Eat More Often: Low-fat or fat-free milk, low-fat plain or light yogurt, reduced-fat or part-skim cheese.  · Eat Less Often/Avoid: Milk (whole, 2%). Whole milk yogurt. Full-fat cheeses.  Nuts, Seeds, and Legumes (4 to 5 servings per week)  · Eat More Often: All without added salt.  · Eat Less Often/Avoid: Salted nuts and seeds, canned beans with added salt.  Fats and Sweets (limited)  · Eat More Often: Vegetable oils, tub margarines without trans fats, sugar-free gelatin. Mayonnaise and salad dressings.  · Eat Less Often/Avoid: Coconut oils, palm oils, butter, stick margarine, cream, half and half, cookies, candy, pie.  FOR MORE INFORMATION  The Dash Diet Eating Plan: www.dashdiet.org  Document Released: 05/06/2011 Document Revised: 08/09/2011 Document Reviewed: 05/06/2011  [employer]® Patient Information ©2014 [employer].

## 2013-08-11 LAB — HEPATIC FUNCTION PANEL
ALT: 20 U/L (ref 0–53)
AST: 16 U/L (ref 0–37)
Albumin: 3.9 g/dL (ref 3.5–5.2)
Alkaline Phosphatase: 71 U/L (ref 39–117)
Bilirubin, Direct: 0.1 mg/dL (ref 0.0–0.3)
Indirect Bilirubin: 0.6 mg/dL (ref 0.2–1.2)
Total Bilirubin: 0.7 mg/dL (ref 0.2–1.2)
Total Protein: 6.8 g/dL (ref 6.0–8.3)

## 2013-08-11 LAB — BASIC METABOLIC PANEL
BUN: 14 mg/dL (ref 6–23)
CO2: 30 mEq/L (ref 19–32)
Calcium: 9.1 mg/dL (ref 8.4–10.5)
Chloride: 101 mEq/L (ref 96–112)
Creat: 1.03 mg/dL (ref 0.50–1.35)
Glucose, Bld: 101 mg/dL — ABNORMAL HIGH (ref 70–99)
Potassium: 3.8 mEq/L (ref 3.5–5.3)
Sodium: 139 mEq/L (ref 135–145)

## 2013-08-11 LAB — LIPID PANEL
Cholesterol: 155 mg/dL (ref 0–200)
HDL: 32 mg/dL — ABNORMAL LOW (ref >39)
LDL Cholesterol: 100 mg/dL — ABNORMAL HIGH (ref 0–99)
Total CHOL/HDL Ratio: 4.8 Ratio
Triglycerides: 117 mg/dL (ref <150)
VLDL: 23 mg/dL (ref 0–40)

## 2013-08-11 LAB — HEMOGLOBIN A1C
Hgb A1c MFr Bld: 5.4 % (ref <5.7)
Mean Plasma Glucose: 108 mg/dL (ref <117)

## 2013-08-12 ENCOUNTER — Encounter: Payer: Self-pay | Admitting: Family Medicine

## 2013-08-12 LAB — PSA: PSA: 0.46 ng/mL (ref <=4.00)

## 2013-12-07 ENCOUNTER — Encounter: Payer: Self-pay | Admitting: Family Medicine

## 2013-12-07 ENCOUNTER — Ambulatory Visit (INDEPENDENT_AMBULATORY_CARE_PROVIDER_SITE_OTHER): Payer: BC Managed Care – PPO | Admitting: Family Medicine

## 2013-12-07 VITALS — BP 140/80 | Ht 69.0 in | Wt 264.2 lb

## 2013-12-07 DIAGNOSIS — I10 Essential (primary) hypertension: Secondary | ICD-10-CM

## 2013-12-07 DIAGNOSIS — Z6838 Body mass index (BMI) 38.0-38.9, adult: Secondary | ICD-10-CM

## 2013-12-07 DIAGNOSIS — E669 Obesity, unspecified: Secondary | ICD-10-CM

## 2013-12-07 DIAGNOSIS — E785 Hyperlipidemia, unspecified: Secondary | ICD-10-CM

## 2013-12-07 NOTE — Progress Notes (Signed)
   Subjective:    Patient ID: Andrew JacobsonDavid W Gregory, male    DOB: May 13, 1956, 58 y.o.   MRN: [id number]  Hypertension This is a chronic problem. The current episode started more than 1 year ago. The problem has been gradually improving since onset. The problem is controlled. Pertinent negatives include no chest pain. There are no associated agents to hypertension. There are no known risk factors for coronary artery disease. Treatments tried: losartan-HCTZ. The current treatment provides significant improvement. There are no compliance problems.     Patient states he has no other concerns at this time.   Review of Systems  Constitutional: Negative for activity change, appetite change and fatigue.  HENT: Negative for congestion.   Respiratory: Negative for cough.   Cardiovascular: Negative for chest pain.  Gastrointestinal: Negative for abdominal pain.  Endocrine: Negative for polydipsia and polyphagia.  Neurological: Negative for weakness.  Psychiatric/Behavioral: Negative for confusion.       Objective:   Physical Exam  Vitals reviewed. Constitutional: He appears well-nourished. No distress.  Cardiovascular: Normal rate, regular rhythm and normal heart sounds.   No murmur heard. Pulmonary/Chest: Effort normal and breath sounds normal. No respiratory distress.  Musculoskeletal: He exhibits no edema.  Lymphadenopathy:    He has no cervical adenopathy.  Neurological: He is alert.  Psychiatric: His behavior is normal.          Assessment & Plan:  HTN-very good control continue current measures.  Hyperlipidemia his LDL was just slightly above the goal he is trying to watch his weight he is taken his medicine needs exercise I do not feel the patient needs to go through further lab work at this time. I recommend that this patient followup in January for wellness exam and we will be doing lab work at that time  He was encouraged to try to lose weight. He was counseled regarding  moderate exercise and diet

## 2014-06-11 ENCOUNTER — Encounter: Payer: Self-pay | Admitting: Family Medicine

## 2014-06-11 ENCOUNTER — Ambulatory Visit (INDEPENDENT_AMBULATORY_CARE_PROVIDER_SITE_OTHER): Payer: BLUE CROSS/BLUE SHIELD | Admitting: Family Medicine

## 2014-06-11 VITALS — BP 118/78 | Ht 69.0 in | Wt 261.0 lb

## 2014-06-11 DIAGNOSIS — Z79899 Other long term (current) drug therapy: Secondary | ICD-10-CM

## 2014-06-11 DIAGNOSIS — R739 Hyperglycemia, unspecified: Secondary | ICD-10-CM

## 2014-06-11 DIAGNOSIS — Z125 Encounter for screening for malignant neoplasm of prostate: Secondary | ICD-10-CM

## 2014-06-11 DIAGNOSIS — I10 Essential (primary) hypertension: Secondary | ICD-10-CM

## 2014-06-11 DIAGNOSIS — E785 Hyperlipidemia, unspecified: Secondary | ICD-10-CM

## 2014-06-11 MED ORDER — PANTOPRAZOLE SODIUM 40 MG PO TBEC: 40.0000 mg | DELAYED_RELEASE_TABLET | Freq: Every day | ORAL | Status: DC

## 2014-06-11 MED ORDER — LOSARTAN POTASSIUM-HCTZ 100-25 MG PO TABS: 1.0000 | ORAL_TABLET | Freq: Every day | ORAL | Status: DC

## 2014-06-11 MED ORDER — LEVOFLOXACIN 500 MG PO TABS: 500.0000 mg | ORAL_TABLET | Freq: Every day | ORAL | Status: DC

## 2014-06-11 NOTE — Progress Notes (Signed)
   Subjective:    Patient ID: Andrew JacobsonDavid W Sickman, male    DOB: Apr 05, 1956, [age] y.o.   MRN: [id number]  Hypertension This is a chronic problem. The current episode started more than 1 year ago. The problem has been gradually improving since onset. The problem is controlled.   Patient needs a refill on his meds.  C/o cold for a week now. Patient has history hypertension he does try to watch his diet does not exercise quite as much as he should his weight stays excessive Patient denies any cardiac symptoms. Does have history of mild hyperlipidemia. Will need lab work Does have family history of prostate cancer denies any particular problems in that area Once refills on his reflux medicine states is under good control No concerns.    Review of Systems Patient relates head congestion drainage sinus pressure not feeling good denies high fever chills or wheezing denies chest tightness pressure pain shortness breath or swelling in the legs denies rectal bleeding or hematuria    Objective:   Physical Exam Eardrums normal mild sinus tenderness throat normal neck supple lungs clear no crackles heart regular pulse normal BP good abdomen soft obese extremities no edema skin warm dry neurologic grossly normal prostate exam normal       Assessment & Plan:  #1 hyperglycemia check A1c watch diet try to lose weight Hyperlipidemia check lab work await results. Reflux continue medication Sinusitis antibiotics prescribed Hypertension decent control continue current measures

## 2015-03-14 ENCOUNTER — Other Ambulatory Visit: Payer: Self-pay | Admitting: Family Medicine

## 2015-03-17 ENCOUNTER — Other Ambulatory Visit: Payer: Self-pay | Admitting: Family Medicine

## 2015-03-17 NOTE — Telephone Encounter (Signed)
May I refill last seen January 2016.

## 2015-03-17 NOTE — Telephone Encounter (Signed)
May have one refill, needs office visit

## 2015-06-09 ENCOUNTER — Ambulatory Visit: Payer: BLUE CROSS/BLUE SHIELD | Admitting: Family Medicine

## 2015-06-17 ENCOUNTER — Ambulatory Visit (INDEPENDENT_AMBULATORY_CARE_PROVIDER_SITE_OTHER): Payer: BLUE CROSS/BLUE SHIELD | Admitting: Family Medicine

## 2015-06-17 ENCOUNTER — Encounter: Payer: Self-pay | Admitting: Family Medicine

## 2015-06-17 VITALS — BP 136/80 | Wt 267.5 lb

## 2015-06-17 DIAGNOSIS — Z125 Encounter for screening for malignant neoplasm of prostate: Secondary | ICD-10-CM

## 2015-06-17 DIAGNOSIS — I10 Essential (primary) hypertension: Secondary | ICD-10-CM | POA: Diagnosis not present

## 2015-06-17 DIAGNOSIS — R739 Hyperglycemia, unspecified: Secondary | ICD-10-CM | POA: Diagnosis not present

## 2015-06-17 DIAGNOSIS — K219 Gastro-esophageal reflux disease without esophagitis: Secondary | ICD-10-CM | POA: Insufficient documentation

## 2015-06-17 DIAGNOSIS — Z0000 Encounter for general adult medical examination without abnormal findings: Secondary | ICD-10-CM | POA: Diagnosis not present

## 2015-06-17 DIAGNOSIS — R785 Finding of other psychotropic drug in blood: Secondary | ICD-10-CM | POA: Diagnosis not present

## 2015-06-17 DIAGNOSIS — E785 Hyperlipidemia, unspecified: Secondary | ICD-10-CM

## 2015-06-17 LAB — POCT GLYCOSYLATED HEMOGLOBIN (HGB A1C): Hemoglobin A1C: 4.9

## 2015-06-17 MED ORDER — LOSARTAN POTASSIUM-HCTZ 100-25 MG PO TABS: 1.0000 | ORAL_TABLET | Freq: Every day | ORAL | Status: DC

## 2015-06-17 MED ORDER — PANTOPRAZOLE SODIUM 40 MG PO TBEC: 40.0000 mg | DELAYED_RELEASE_TABLET | Freq: Every day | ORAL | Status: DC

## 2015-06-17 NOTE — Patient Instructions (Signed)
DASH Eating Plan  DASH stands for "Dietary Approaches to Stop Hypertension." The DASH eating plan is a healthy eating plan that has been shown to reduce high blood pressure (hypertension). Additional health benefits may include reducing the risk of type 2 diabetes mellitus, heart disease, and stroke. The DASH eating plan may also help with weight loss.  WHAT DO I NEED TO KNOW ABOUT THE DASH EATING PLAN?  For the DASH eating plan, you will follow these general guidelines:  · Choose foods with a percent daily value for sodium of less than 5% (as listed on the food label).  · Use salt-free seasonings or herbs instead of table salt or sea salt.  · Check with your health care provider or pharmacist before using salt substitutes.  · Eat lower-sodium products, often labeled as "lower sodium" or "no salt added."  · Eat fresh foods.  · Eat more vegetables, fruits, and low-fat dairy products.  · Choose whole grains. Look for the word "whole" as the first word in the ingredient list.  · Choose fish and skinless chicken or turkey more often than red meat. Limit fish, poultry, and meat to 6 oz (170 g) each day.  · Limit sweets, desserts, sugars, and sugary drinks.  · Choose heart-healthy fats.  · Limit cheese to 1 oz (28 g) per day.  · Eat more home-cooked food and less restaurant, buffet, and fast food.  · Limit fried foods.  · Cook foods using methods other than frying.  · Limit canned vegetables. If you do use them, rinse them well to decrease the sodium.  · When eating at a restaurant, ask that your food be prepared with less salt, or no salt if possible.  WHAT FOODS CAN I EAT?  Seek help from a dietitian for individual calorie needs.  Grains  Whole grain or whole wheat bread. Brown rice. Whole grain or whole wheat pasta. Quinoa, bulgur, and whole grain cereals. Low-sodium cereals. Corn or whole wheat flour tortillas. Whole grain cornbread. Whole grain crackers. Low-sodium crackers.  Vegetables  Fresh or frozen vegetables  (raw, steamed, roasted, or grilled). Low-sodium or reduced-sodium tomato and vegetable juices. Low-sodium or reduced-sodium tomato sauce and paste. Low-sodium or reduced-sodium canned vegetables.   Fruits  All fresh, canned (in natural juice), or frozen fruits.  Meat and Other Protein Products  Ground beef (85% or leaner), grass-fed beef, or beef trimmed of fat. Skinless chicken or turkey. Ground chicken or turkey. Pork trimmed of fat. All fish and seafood. Eggs. Dried beans, peas, or lentils. Unsalted nuts and seeds. Unsalted canned beans.  Dairy  Low-fat dairy products, such as skim or 1% milk, 2% or reduced-fat cheeses, low-fat ricotta or cottage cheese, or plain low-fat yogurt. Low-sodium or reduced-sodium cheeses.  Fats and Oils  Tub margarines without trans fats. Light or reduced-fat mayonnaise and salad dressings (reduced sodium). Avocado. Safflower, olive, or canola oils. Natural peanut or almond butter.  Other  Unsalted popcorn and pretzels.  The items listed above may not be a complete list of recommended foods or beverages. Contact your dietitian for more options.  WHAT FOODS ARE NOT RECOMMENDED?  Grains  White bread. White pasta. White rice. Refined cornbread. Bagels and croissants. Crackers that contain trans fat.  Vegetables  Creamed or fried vegetables. Vegetables in a cheese sauce. Regular canned vegetables. Regular canned tomato sauce and paste. Regular tomato and vegetable juices.  Fruits  Dried fruits. Canned fruit in light or heavy syrup. Fruit juice.  Meat and Other Protein   Products  Fatty cuts of meat. Ribs, chicken wings, bacon, sausage, bologna, salami, chitterlings, fatback, hot dogs, bratwurst, and packaged luncheon meats. Salted nuts and seeds. Canned beans with salt.  Dairy  Whole or 2% milk, cream, half-and-half, and cream cheese. Whole-fat or sweetened yogurt. Full-fat cheeses or blue cheese. Nondairy creamers and whipped toppings. Processed cheese, cheese spreads, or cheese  curds.  Condiments  Onion and garlic salt, seasoned salt, table salt, and sea salt. Canned and packaged gravies. Worcestershire sauce. Tartar sauce. Barbecue sauce. Teriyaki sauce. Soy sauce, including reduced sodium. Steak sauce. Fish sauce. Oyster sauce. Cocktail sauce. Horseradish. Ketchup and mustard. Meat flavorings and tenderizers. Bouillon cubes. Hot sauce. Tabasco sauce. Marinades. Taco seasonings. Relishes.  Fats and Oils  Butter, stick margarine, lard, shortening, ghee, and bacon fat. Coconut, palm kernel, or palm oils. Regular salad dressings.  Other  Pickles and olives. Salted popcorn and pretzels.  The items listed above may not be a complete list of foods and beverages to avoid. Contact your dietitian for more information.  WHERE CAN I FIND MORE INFORMATION?  National Heart, Lung, and Blood Institute: www.nhlbi.nih.gov/health/health-topics/topics/dash/     This information is not intended to replace advice given to you by your health care provider. Make sure you discuss any questions you have with your health care provider.     Document Released: 05/06/2011 Document Revised: 06/07/2014 Document Reviewed: 03/21/2013  Elsevier Interactive Patient Education ©2016 [employer] Inc[employer]

## 2015-06-17 NOTE — Progress Notes (Signed)
Subjective:    Patient ID: Andrew Manning, male    DOB: [birth year], 60 y.o.   MRN: [id number]  Hypertension This is a chronic problem. The current episode started more than 1 year ago. Pertinent negatives include no chest pain, headaches or neck pain. There are no compliance problems.    Patient has c/o of right knee pain.  Results for orders placed or performed in visit on 06/17/15  POCT HgB A1C  Result Value Ref Range   Hemoglobin A1C 4.9    Safety dietary measures discussed with the patient. Patient does a good job of trying to be safe He tries watch how he eats but not always successful Stays active at work but does not do any regular exercise Denies any hematuria or rectal bleeding Denies any falls or accidents Denies depression Relates compliance with his medicines Patient up-to-date on colonoscopy Had tetanus shot for 5 years ago Did not do flu vaccine this has been recommended to him No family history of prostate cancer or colon cancer Review of Systems  Constitutional: Negative for fever, activity change and appetite change.  HENT: Negative for congestion and rhinorrhea.   Eyes: Negative for discharge.  Respiratory: Negative for cough and wheezing.   Cardiovascular: Negative for chest pain.  Gastrointestinal: Negative for vomiting, abdominal pain and blood in stool.  Genitourinary: Negative for frequency and difficulty urinating.  Musculoskeletal: Negative for neck pain.  Skin: Negative for rash.  Allergic/Immunologic: Negative for environmental allergies and food allergies.  Neurological: Negative for weakness and headaches.  Psychiatric/Behavioral: Negative for agitation.       Objective:   Physical Exam  Constitutional: He appears well-developed and well-nourished.  HENT:  Head: Normocephalic and atraumatic.  Right Ear: External ear normal.  Left Ear: External ear normal.  Nose: Nose normal.  Mouth/Throat: Oropharynx is clear and moist.  Eyes: EOM  are normal. Pupils are equal, round, and reactive to light.  Neck: Normal range of motion. Neck supple. No thyromegaly present.  Cardiovascular: Normal rate, regular rhythm and normal heart sounds.   No murmur heard. Pulmonary/Chest: Effort normal and breath sounds normal. No respiratory distress. He has no wheezes.  Abdominal: Soft. Bowel sounds are normal. He exhibits no distension and no mass. There is no tenderness.  Genitourinary: Prostate normal and penis normal.  Musculoskeletal: Normal range of motion. He exhibits no edema.  Lymphadenopathy:    He has no cervical adenopathy.  Neurological: He is alert. He exhibits normal muscle tone.  Skin: Skin is warm and dry. No erythema.  Psychiatric: He has a normal mood and affect. His behavior is normal. Judgment normal.          Assessment & Plan:  1. Hyperglycemia Importance of eating a healthy diet try and lose weight was discussed with the patient his A1c looks very good. Patient at risk of developing diabetes. - POCT HgB A1C - pantoprazole (PROTONIX) 40 MG tablet; Take 1 tablet (40 mg total) by mouth daily.  Dispense: 90 tablet; Refill: 3 - losartan-hydrochlorothiazide (HYZAAR) 100-25 MG tablet; Take 1 tablet by mouth daily.  Dispense: 90 tablet; Refill: 3 - Basic metabolic panel - Lipid panel - Hepatic function panel - PSA  2. HTN (hypertension), benign Blood pressure under good control continue current measures. Regular exercise try to lose weight would be recommended. - pantoprazole (PROTONIX) 40 MG tablet; Take 1 tablet (40 mg total) by mouth daily.  Dispense: 90 tablet; Refill: 3 - losartan-hydrochlorothiazide (HYZAAR) 100-25 MG tablet; Take 1 tablet by  mouth daily.  Dispense: 90 tablet; Refill: 3 - Basic metabolic panel - Lipid panel - Hepatic function panel - PSA  3. Hyperlipidemia Hyperlipidemia the importance of checking lab work awaiting the results monitoring cholesterol and possible medication - pantoprazole  (PROTONIX) 40 MG tablet; Take 1 tablet (40 mg total) by mouth daily.  Dispense: 90 tablet; Refill: 3 - losartan-hydrochlorothiazide (HYZAAR) 100-25 MG tablet; Take 1 tablet by mouth daily.  Dispense: 90 tablet; Refill: 3 - Basic metabolic panel - Lipid panel - Hepatic function panel - PSA  4. Gastroesophageal reflux disease without esophagitis Patient relates he does not take his medication he does get intermittent indigestion he does take his medicine on a regular basis. - pantoprazole (PROTONIX) 40 MG tablet; Take 1 tablet (40 mg total) by mouth daily.  Dispense: 90 tablet; Refill: 3 - losartan-hydrochlorothiazide (HYZAAR) 100-25 MG tablet; Take 1 tablet by mouth daily.  Dispense: 90 tablet; Refill: 3 - Basic metabolic panel - Lipid panel - Hepatic function panel - PSA  5. Prostate cancer screening Patients prostate exam normal PSA pending - pantoprazole (PROTONIX) 40 MG tablet; Take 1 tablet (40 mg total) by mouth daily.  Dispense: 90 tablet; Refill: 3 - losartan-hydrochlorothiazide (HYZAAR) 100-25 MG tablet; Take 1 tablet by mouth daily.  Dispense: 90 tablet; Refill: 3 - Basic metabolic panel - Lipid panel - Hepatic function panel - PSA  6. Routine general medical examination at a health care facility Safety measures dietary discussed Regular exercise discuss Weight loss discussed Colonoscopy 2022 Patient to follow-up if progressive troubles.

## 2015-06-23 ENCOUNTER — Encounter: Payer: Self-pay | Admitting: Family Medicine

## 2015-06-23 LAB — HEPATIC FUNCTION PANEL
ALT: 24 IU/L (ref 0–44)
AST: 23 IU/L (ref 0–40)
Albumin: 4.1 g/dL (ref 3.5–5.5)
Alkaline Phosphatase: 79 IU/L (ref 39–117)
Bilirubin Total: 0.5 mg/dL (ref 0.0–1.2)
Bilirubin, Direct: 0.13 mg/dL (ref 0.00–0.40)
Total Protein: 6.4 g/dL (ref 6.0–8.5)

## 2015-06-23 LAB — BASIC METABOLIC PANEL
BUN/Creatinine Ratio: 12 (ref 9–20)
BUN: 12 mg/dL (ref 6–24)
CO2: 25 mmol/L (ref 18–29)
Calcium: 8.8 mg/dL (ref 8.7–10.2)
Chloride: 100 mmol/L (ref 96–106)
Creatinine, Ser: 0.97 mg/dL (ref 0.76–1.27)
GFR calc Af Amer: 98 mL/min/{1.73_m2} (ref >59)
GFR calc non Af Amer: 85 mL/min/{1.73_m2} (ref >59)
Glucose: 99 mg/dL (ref 65–99)
Potassium: 3.6 mmol/L (ref 3.5–5.2)
Sodium: 142 mmol/L (ref 134–144)

## 2015-06-23 LAB — LIPID PANEL
Chol/HDL Ratio: 4.8 ratio units (ref 0.0–5.0)
Cholesterol, Total: 149 mg/dL (ref 100–199)
HDL: 31 mg/dL — ABNORMAL LOW (ref >39)
LDL Calculated: 95 mg/dL (ref 0–99)
Triglycerides: 114 mg/dL (ref 0–149)
VLDL Cholesterol Cal: 23 mg/dL (ref 5–40)

## 2015-06-23 LAB — PSA: Prostate Specific Ag, Serum: 0.5 ng/mL (ref 0.0–4.0)

## 2015-08-14 ENCOUNTER — Ambulatory Visit (INDEPENDENT_AMBULATORY_CARE_PROVIDER_SITE_OTHER): Payer: BLUE CROSS/BLUE SHIELD | Admitting: Family Medicine

## 2015-08-14 ENCOUNTER — Encounter: Payer: Self-pay | Admitting: Family Medicine

## 2015-08-14 VITALS — BP 122/72 | Temp 98.9°F | Ht 69.0 in | Wt 267.5 lb

## 2015-08-14 DIAGNOSIS — J3089 Other allergic rhinitis: Secondary | ICD-10-CM

## 2015-08-14 DIAGNOSIS — J019 Acute sinusitis, unspecified: Secondary | ICD-10-CM

## 2015-08-14 DIAGNOSIS — B9689 Other specified bacterial agents as the cause of diseases classified elsewhere: Secondary | ICD-10-CM

## 2015-08-14 MED ORDER — DOXYCYCLINE HYCLATE 100 MG PO CAPS: 100.0000 mg | ORAL_CAPSULE | Freq: Two times a day (BID) | ORAL | Status: DC

## 2015-08-14 NOTE — Progress Notes (Signed)
   Subjective:    Patient ID: Andrew JacobsonDavid W Greenough, male    DOB: December 18, 1955, 60 y.o.   MRN: [id number]  Sinusitis This is a new problem. The current episode started in the past 7 days. Associated symptoms include congestion, coughing, headaches, sinus pressure and a sore throat. (Runny nose) Treatments tried: Nyquil, Alka Seltzer cold.   Patient states no other concerns this visit. Symptoms having gone on over the past several days sinus pressure pain discomfort drainage also allergy issues. Review of Systems  HENT: Positive for congestion, sinus pressure and sore throat.   Respiratory: Positive for cough.   Neurological: Positive for headaches.       Objective:   Physical Exam  Constitutional: He appears well-developed.  HENT:  Head: Normocephalic.  Mouth/Throat: Oropharynx is clear and moist. No oropharyngeal exudate.  Neck: Normal range of motion.  Cardiovascular: Normal rate, regular rhythm and normal heart sounds.   No murmur heard. Pulmonary/Chest: Effort normal and breath sounds normal. He has no wheezes.  Lymphadenopathy:    He has no cervical adenopathy.  Neurological: He exhibits normal muscle tone.  Skin: Skin is warm and dry.  Nursing note and vitals reviewed.         Assessment & Plan:  Patient was seen today for upper respiratory illness. It is felt that the patient is dealing with sinusitis. Antibiotics were prescribed today. Importance of compliance with medication was discussed. Symptoms should gradually resolve over the course of the next several days. If high fevers, progressive illness, difficulty breathing, worsening condition or failure for symptoms to improve over the next several days then the patient is to follow-up. If any emergent conditions the patient is to follow-up in the emergency department otherwise to follow-up in the office. In addition to the antibiotics I recommend allergy medication on a regular basis Flonase OTC measures as well

## 2015-09-01 DIAGNOSIS — C44519 Basal cell carcinoma of skin of other part of trunk: Secondary | ICD-10-CM | POA: Diagnosis not present

## 2015-09-01 DIAGNOSIS — D1800 Hemangioma unspecified site: Secondary | ICD-10-CM | POA: Diagnosis not present

## 2015-09-01 DIAGNOSIS — L570 Actinic keratosis: Secondary | ICD-10-CM | POA: Diagnosis not present

## 2015-09-01 DIAGNOSIS — D225 Melanocytic nevi of trunk: Secondary | ICD-10-CM | POA: Diagnosis not present

## 2015-09-01 DIAGNOSIS — D485 Neoplasm of uncertain behavior of skin: Secondary | ICD-10-CM | POA: Diagnosis not present

## 2015-10-02 DIAGNOSIS — D485 Neoplasm of uncertain behavior of skin: Secondary | ICD-10-CM | POA: Diagnosis not present

## 2015-10-16 DIAGNOSIS — C44519 Basal cell carcinoma of skin of other part of trunk: Secondary | ICD-10-CM | POA: Diagnosis not present

## 2015-10-23 ENCOUNTER — Encounter: Payer: Self-pay | Admitting: Family Medicine

## 2015-10-23 ENCOUNTER — Ambulatory Visit (INDEPENDENT_AMBULATORY_CARE_PROVIDER_SITE_OTHER): Payer: BLUE CROSS/BLUE SHIELD | Admitting: Family Medicine

## 2015-10-23 VITALS — BP 112/70 | Temp 99.1°F | Ht 69.0 in | Wt 257.4 lb

## 2015-10-23 DIAGNOSIS — R509 Fever, unspecified: Secondary | ICD-10-CM | POA: Diagnosis not present

## 2015-10-23 MED ORDER — DOXYCYCLINE HYCLATE 100 MG PO TABS: 100.0000 mg | ORAL_TABLET | Freq: Two times a day (BID) | ORAL | Status: DC

## 2015-10-23 NOTE — Progress Notes (Signed)
   Subjective:    Patient ID: Andrew Manning, male    DOB: Oct 22, 1955, 60 y.o.   MRN: [id number]  Fever  This is a new problem. The current episode started yesterday. The maximum temperature noted was 101 to 101.9 F. The temperature was taken using an oral thermometer. Associated symptoms include headaches and muscle aches. Associated symptoms comments: Chills. Treatments tried: Ibuprofen.   Patient states no other concerns this visit.  Noticed at lunch yest  Developed a headache  Was iun and out, wondered if associated  Noted aches and chills  Felt a little better today  No cough or cong  Urine flow  No tick bite in the woods once   No one else sick at home or work     Review of Systems  Constitutional: Positive for fever.  Neurological: Positive for headaches.       Objective:   Physical Exam  Alert vitals stable mild malaise. Low-grade fever HEENT normal. Lungs clear. Heart regular in rhythm.      Assessment & Plan:  Impression 1 probable viral syndrome but with fever and headache need to cover for tickborne illness discussed at length plan docs he 100 twice a day symptom care discussed warning signs discussed WSL

## 2016-04-21 ENCOUNTER — Telehealth: Payer: Self-pay | Admitting: Family Medicine

## 2016-04-21 DIAGNOSIS — Z79899 Other long term (current) drug therapy: Secondary | ICD-10-CM

## 2016-04-21 DIAGNOSIS — Z0000 Encounter for general adult medical examination without abnormal findings: Secondary | ICD-10-CM

## 2016-04-21 DIAGNOSIS — R739 Hyperglycemia, unspecified: Secondary | ICD-10-CM

## 2016-04-21 DIAGNOSIS — E785 Hyperlipidemia, unspecified: Secondary | ICD-10-CM

## 2016-04-21 DIAGNOSIS — I10 Essential (primary) hypertension: Secondary | ICD-10-CM

## 2016-04-21 DIAGNOSIS — Z125 Encounter for screening for malignant neoplasm of prostate: Secondary | ICD-10-CM

## 2016-04-21 NOTE — Telephone Encounter (Signed)
Patient requesting lab paper has physical 12/26.

## 2016-04-25 NOTE — Telephone Encounter (Signed)
PSA, metabolic 7, lipid, liver, hemoglobin A1c.

## 2016-04-26 NOTE — Telephone Encounter (Signed)
Lab orders ready. Pt notified 

## 2016-04-28 DIAGNOSIS — Z85828 Personal history of other malignant neoplasm of skin: Secondary | ICD-10-CM | POA: Diagnosis not present

## 2016-04-28 DIAGNOSIS — L570 Actinic keratosis: Secondary | ICD-10-CM | POA: Diagnosis not present

## 2016-04-28 DIAGNOSIS — D485 Neoplasm of uncertain behavior of skin: Secondary | ICD-10-CM | POA: Diagnosis not present

## 2016-05-14 DIAGNOSIS — I10 Essential (primary) hypertension: Secondary | ICD-10-CM | POA: Diagnosis not present

## 2016-05-14 DIAGNOSIS — E785 Hyperlipidemia, unspecified: Secondary | ICD-10-CM | POA: Diagnosis not present

## 2016-05-14 DIAGNOSIS — R739 Hyperglycemia, unspecified: Secondary | ICD-10-CM | POA: Diagnosis not present

## 2016-05-14 DIAGNOSIS — Z79899 Other long term (current) drug therapy: Secondary | ICD-10-CM | POA: Diagnosis not present

## 2016-05-14 DIAGNOSIS — Z0000 Encounter for general adult medical examination without abnormal findings: Secondary | ICD-10-CM | POA: Diagnosis not present

## 2016-05-15 LAB — LIPID PANEL
Chol/HDL Ratio: 4.4 ratio units (ref 0.0–5.0)
Cholesterol, Total: 146 mg/dL (ref 100–199)
HDL: 33 mg/dL — ABNORMAL LOW (ref >39)
LDL Calculated: 96 mg/dL (ref 0–99)
Triglycerides: 87 mg/dL (ref 0–149)
VLDL Cholesterol Cal: 17 mg/dL (ref 5–40)

## 2016-05-15 LAB — HEMOGLOBIN A1C
Est. average glucose Bld gHb Est-mCnc: 103 mg/dL
Hgb A1c MFr Bld: 5.2 % (ref 4.8–5.6)

## 2016-05-15 LAB — HEPATIC FUNCTION PANEL
ALT: 22 IU/L (ref 0–44)
AST: 19 IU/L (ref 0–40)
Albumin: 4.1 g/dL (ref 3.6–4.8)
Alkaline Phosphatase: 72 IU/L (ref 39–117)
Bilirubin Total: 0.4 mg/dL (ref 0.0–1.2)
Bilirubin, Direct: 0.13 mg/dL (ref 0.00–0.40)
Total Protein: 6.5 g/dL (ref 6.0–8.5)

## 2016-05-15 LAB — BASIC METABOLIC PANEL
BUN/Creatinine Ratio: 13 (ref 10–24)
BUN: 13 mg/dL (ref 8–27)
CO2: 26 mmol/L (ref 18–29)
Calcium: 8.7 mg/dL (ref 8.6–10.2)
Chloride: 101 mmol/L (ref 96–106)
Creatinine, Ser: 1.02 mg/dL (ref 0.76–1.27)
GFR calc Af Amer: 92 mL/min/{1.73_m2} (ref >59)
GFR calc non Af Amer: 80 mL/min/{1.73_m2} (ref >59)
Glucose: 96 mg/dL (ref 65–99)
Potassium: 3.6 mmol/L (ref 3.5–5.2)
Sodium: 142 mmol/L (ref 134–144)

## 2016-05-15 LAB — PSA: Prostate Specific Ag, Serum: 0.4 ng/mL (ref 0.0–4.0)

## 2016-05-25 ENCOUNTER — Ambulatory Visit (INDEPENDENT_AMBULATORY_CARE_PROVIDER_SITE_OTHER): Payer: BLUE CROSS/BLUE SHIELD | Admitting: Family Medicine

## 2016-05-25 ENCOUNTER — Encounter: Payer: Self-pay | Admitting: Family Medicine

## 2016-05-25 VITALS — BP 132/74 | Ht 69.5 in | Wt 263.2 lb

## 2016-05-25 DIAGNOSIS — E784 Other hyperlipidemia: Secondary | ICD-10-CM

## 2016-05-25 DIAGNOSIS — K219 Gastro-esophageal reflux disease without esophagitis: Secondary | ICD-10-CM

## 2016-05-25 DIAGNOSIS — I10 Essential (primary) hypertension: Secondary | ICD-10-CM

## 2016-05-25 DIAGNOSIS — Z125 Encounter for screening for malignant neoplasm of prostate: Secondary | ICD-10-CM

## 2016-05-25 DIAGNOSIS — E7849 Other hyperlipidemia: Secondary | ICD-10-CM

## 2016-05-25 DIAGNOSIS — Z23 Encounter for immunization: Secondary | ICD-10-CM | POA: Diagnosis not present

## 2016-05-25 DIAGNOSIS — Z0000 Encounter for general adult medical examination without abnormal findings: Secondary | ICD-10-CM

## 2016-05-25 DIAGNOSIS — E669 Obesity, unspecified: Secondary | ICD-10-CM | POA: Diagnosis not present

## 2016-05-25 DIAGNOSIS — Z6838 Body mass index (BMI) 38.0-38.9, adult: Secondary | ICD-10-CM | POA: Diagnosis not present

## 2016-05-25 MED ORDER — PANTOPRAZOLE SODIUM 40 MG PO TBEC: 40.0000 mg | DELAYED_RELEASE_TABLET | Freq: Every day | ORAL | 3 refills | Status: DC

## 2016-05-25 MED ORDER — LOSARTAN POTASSIUM-HCTZ 100-25 MG PO TABS: 1.0000 | ORAL_TABLET | Freq: Every day | ORAL | 3 refills | Status: DC

## 2016-05-25 NOTE — Patient Instructions (Signed)
DASH Eating Plan DASH stands for "Dietary Approaches to Stop Hypertension." The DASH eating plan is a healthy eating plan that has been shown to reduce high blood pressure (hypertension). Additional health benefits may include reducing the risk of type 2 diabetes mellitus, heart disease, and stroke. The DASH eating plan may also help with weight loss. What do I need to know about the DASH eating plan? For the DASH eating plan, you will follow these general guidelines:  Choose foods with less than 150 milligrams of sodium per serving (as listed on the food label).  Use salt-free seasonings or herbs instead of table salt or sea salt.  Check with your health care provider or pharmacist before using salt substitutes.  Eat lower-sodium products. These are often labeled as "low-sodium" or "no salt added."  Eat fresh foods. Avoid eating a lot of canned foods.  Eat more vegetables, fruits, and low-fat dairy products.  Choose whole grains. Look for the word "whole" as the first word in the ingredient list.  Choose fish and skinless chicken or turkey more often than red meat. Limit fish, poultry, and meat to 6 oz (170 g) each day.  Limit sweets, desserts, sugars, and sugary drinks.  Choose heart-healthy fats.  Eat more home-cooked food and less restaurant, buffet, and fast food.  Limit fried foods.  Do not fry foods. Cook foods using methods such as baking, boiling, grilling, and broiling instead.  When eating at a restaurant, ask that your food be prepared with less salt, or no salt if possible. What foods can I eat? Seek help from a dietitian for individual calorie needs. Grains  Whole grain or whole wheat bread. Brown rice. Whole grain or whole wheat pasta. Quinoa, bulgur, and whole grain cereals. Low-sodium cereals. Corn or whole wheat flour tortillas. Whole grain cornbread. Whole grain crackers. Low-sodium crackers. Vegetables  Fresh or frozen vegetables (raw, steamed, roasted, or  grilled). Low-sodium or reduced-sodium tomato and vegetable juices. Low-sodium or reduced-sodium tomato sauce and paste. Low-sodium or reduced-sodium canned vegetables. Fruits  All fresh, canned (in natural juice), or frozen fruits. Meat and Other Protein Products  Ground beef (85% or leaner), grass-fed beef, or beef trimmed of fat. Skinless chicken or turkey. Ground chicken or turkey. Pork trimmed of fat. All fish and seafood. Eggs. Dried beans, peas, or lentils. Unsalted nuts and seeds. Unsalted canned beans. Dairy  Low-fat dairy products, such as skim or 1% milk, 2% or reduced-fat cheeses, low-fat ricotta or cottage cheese, or plain low-fat yogurt. Low-sodium or reduced-sodium cheeses. Fats and Oils  Tub margarines without trans fats. Light or reduced-fat mayonnaise and salad dressings (reduced sodium). Avocado. Safflower, olive, or canola oils. Natural peanut or almond butter. Other  Unsalted popcorn and pretzels. The items listed above may not be a complete list of recommended foods or beverages. Contact your dietitian for more options.  What foods are not recommended? Grains  White bread. White pasta. White rice. Refined cornbread. Bagels and croissants. Crackers that contain trans fat. Vegetables  Creamed or fried vegetables. Vegetables in a cheese sauce. Regular canned vegetables. Regular canned tomato sauce and paste. Regular tomato and vegetable juices. Fruits  Canned fruit in light or heavy syrup. Fruit juice. Meat and Other Protein Products  Fatty cuts of meat. Ribs, chicken wings, bacon, sausage, bologna, salami, chitterlings, fatback, hot dogs, bratwurst, and packaged luncheon meats. Salted nuts and seeds. Canned beans with salt. Dairy  Whole or 2% milk, cream, half-and-half, and cream cheese. Whole-fat or sweetened yogurt. Full-fat cheeses   or blue cheese. Nondairy creamers and whipped toppings. Processed cheese, cheese spreads, or cheese curds. Condiments  Onion and garlic  salt, seasoned salt, table salt, and sea salt. Canned and packaged gravies. Worcestershire sauce. Tartar sauce. Barbecue sauce. Teriyaki sauce. Soy sauce, including reduced sodium. Steak sauce. Fish sauce. Oyster sauce. Cocktail sauce. Horseradish. Ketchup and mustard. Meat flavorings and tenderizers. Bouillon cubes. Hot sauce. Tabasco sauce. Marinades. Taco seasonings. Relishes. Fats and Oils  Butter, stick margarine, lard, shortening, ghee, and bacon fat. Coconut, palm kernel, or palm oils. Regular salad dressings. Other  Pickles and olives. Salted popcorn and pretzels. The items listed above may not be a complete list of foods and beverages to avoid. Contact your dietitian for more information.  Where can I find more information? National Heart, Lung, and Blood Institute: www.nhlbi.nih.gov/health/health-topics/topics/dash/ This information is not intended to replace advice given to you by your health care provider. Make sure you discuss any questions you have with your health care provider. Document Released: 05/06/2011 Document Revised: 10/23/2015 Document Reviewed: 03/21/2013 Elsevier Interactive Patient Education  2017 [employer]  

## 2016-05-25 NOTE — Progress Notes (Signed)
Subjective:    Patient ID: Andrew JacobsonDavid W Manning, male    DOB: 1956/04/07, 60 y.o.   MRN: [id number]  HPI  The patient comes in today for a wellness visit.    A review of their health history was completed.  A review of medications was also completed.  Any needed refills; yes  Eating habits: trying to eat healthy  Falls/  MVA accidents in past few months: none  Regular exercise: walking  Specialist pt sees on regular basis: dermatologist every 6 month  Preventative health issues were discussed.   Additional concerns: none The patient does take his blood pressure medicine on a regular basis watches his diet closely try stay active but unable to do a lot of exercise because of his work been so busy  Does have reflux issues frequently but denies any severe pain with it the reflux medication keeps the discomfort under good control patient does try to watch his diet  Patient suffers with obesity been unable to lose weight partly because of genetics partly because of his work schedule diet and exercise were discussed in detail  Review of Systems  Constitutional: Negative for activity change, appetite change and fever.  HENT: Negative for congestion and rhinorrhea.   Eyes: Negative for discharge.  Respiratory: Negative for cough and wheezing.   Cardiovascular: Negative for chest pain.  Gastrointestinal: Negative for abdominal pain, blood in stool and vomiting.  Genitourinary: Negative for difficulty urinating and frequency.  Musculoskeletal: Negative for neck pain.  Skin: Negative for rash.  Allergic/Immunologic: Negative for environmental allergies and food allergies.  Neurological: Negative for weakness and headaches.  Psychiatric/Behavioral: Negative for agitation.       Objective:   Physical Exam  Constitutional: He appears well-developed and well-nourished.  HENT:  Head: Normocephalic and atraumatic.  Right Ear: External ear normal.  Left Ear: External ear normal.    Nose: Nose normal.  Mouth/Throat: Oropharynx is clear and moist.  Eyes: EOM are normal. Pupils are equal, round, and reactive to light.  Neck: Normal range of motion. Neck supple. No thyromegaly present.  Cardiovascular: Normal rate, regular rhythm and normal heart sounds.   No murmur heard. Pulmonary/Chest: Effort normal and breath sounds normal. No respiratory distress. He has no wheezes.  Abdominal: Soft. Bowel sounds are normal. He exhibits no distension and no mass. There is no tenderness.  Genitourinary: Penis normal.  Musculoskeletal: Normal range of motion. He exhibits no edema.  Lymphadenopathy:    He has no cervical adenopathy.  Neurological: He is alert. He exhibits normal muscle tone.  Skin: Skin is warm and dry. No erythema.  Psychiatric: He has a normal mood and affect. His behavior is normal. Judgment normal.          Assessment & Plan:  T Adult wellness-complete.wellness physical was conducted today. Importance of diet and exercise were discussed in detail. In addition to this a discussion regarding safety was also covered. We also reviewed over immunizations and gave recommendations regarding current immunization needed for age. In addition to this additional areas were also touched on including: Preventative health exams needed: Colonoscopy up-to-date next is 2022  Patient was advised yearly wellness exam  HTN- Patient was seen today as part of a visit regarding hypertension. The importance of healthy diet and regular physical activity was discussed. The importance of compliance with medications discussed. Ideal goal is to keep blood pressure low elevated levels certainly below 140/90 when possible. The patient was counseled that keeping blood pressure under control lessen  his risk of heart attack, stroke, kidney failure, and early death. The importance of regular follow-ups was discussed with the patient. Low-salt diet such as DASH recommended. Regular physical  activity was recommended as well. Patient was advised to keep regular follow-ups.  Patient has reflux under good control with current medication when he doesn't take it he still has reflux therefore continue medication

## 2016-08-31 DIAGNOSIS — D485 Neoplasm of uncertain behavior of skin: Secondary | ICD-10-CM | POA: Diagnosis not present

## 2016-08-31 DIAGNOSIS — Z85828 Personal history of other malignant neoplasm of skin: Secondary | ICD-10-CM | POA: Diagnosis not present

## 2016-08-31 DIAGNOSIS — L570 Actinic keratosis: Secondary | ICD-10-CM | POA: Diagnosis not present

## 2016-08-31 DIAGNOSIS — B353 Tinea pedis: Secondary | ICD-10-CM | POA: Diagnosis not present

## 2017-03-04 DIAGNOSIS — Z029 Encounter for administrative examinations, unspecified: Secondary | ICD-10-CM

## 2017-04-14 ENCOUNTER — Encounter: Payer: Self-pay | Admitting: Internal Medicine

## 2017-05-18 ENCOUNTER — Telehealth: Payer: Self-pay | Admitting: Family Medicine

## 2017-05-18 DIAGNOSIS — R739 Hyperglycemia, unspecified: Secondary | ICD-10-CM

## 2017-05-18 DIAGNOSIS — I10 Essential (primary) hypertension: Secondary | ICD-10-CM

## 2017-05-18 DIAGNOSIS — E7849 Other hyperlipidemia: Secondary | ICD-10-CM

## 2017-05-18 DIAGNOSIS — Z125 Encounter for screening for malignant neoplasm of prostate: Secondary | ICD-10-CM

## 2017-05-18 NOTE — Telephone Encounter (Signed)
Pt's PE is 06/14/2017, needs BW ordered  Please call pt when orders are ready

## 2017-05-18 NOTE — Telephone Encounter (Signed)
Patient had Lipid, Liver, Met 7, HgbA1c and PSA 04/2016

## 2017-05-18 NOTE — Telephone Encounter (Signed)
The patient may have lipid, liver, metabolic 7, CBC, PSA

## 2017-05-19 NOTE — Telephone Encounter (Signed)
Blood work ordered in Epic. Patient notified. 

## 2017-06-07 DIAGNOSIS — R739 Hyperglycemia, unspecified: Secondary | ICD-10-CM | POA: Diagnosis not present

## 2017-06-07 DIAGNOSIS — Z125 Encounter for screening for malignant neoplasm of prostate: Secondary | ICD-10-CM | POA: Diagnosis not present

## 2017-06-07 DIAGNOSIS — I10 Essential (primary) hypertension: Secondary | ICD-10-CM | POA: Diagnosis not present

## 2017-06-07 DIAGNOSIS — E7849 Other hyperlipidemia: Secondary | ICD-10-CM | POA: Diagnosis not present

## 2017-06-08 LAB — CBC WITH DIFFERENTIAL/PLATELET
Basophils Absolute: 0 10*3/uL (ref 0.0–0.2)
Basos: 1 %
EOS (ABSOLUTE): 0.2 10*3/uL (ref 0.0–0.4)
Eos: 2 %
Hematocrit: 44.1 % (ref 37.5–51.0)
Hemoglobin: 15.9 g/dL (ref 13.0–17.7)
Immature Grans (Abs): 0 10*3/uL (ref 0.0–0.1)
Immature Granulocytes: 0 %
Lymphocytes Absolute: 2.4 10*3/uL (ref 0.7–3.1)
Lymphs: 28 %
MCH: 30.9 pg (ref 26.6–33.0)
MCHC: 36.1 g/dL — ABNORMAL HIGH (ref 31.5–35.7)
MCV: 86 fL (ref 79–97)
Monocytes Absolute: 1 10*3/uL — ABNORMAL HIGH (ref 0.1–0.9)
Monocytes: 12 %
Neutrophils Absolute: 5 10*3/uL (ref 1.4–7.0)
Neutrophils: 57 %
Platelets: 307 10*3/uL (ref 150–379)
RBC: 5.15 x10E6/uL (ref 4.14–5.80)
RDW: 13.7 % (ref 12.3–15.4)
WBC: 8.6 10*3/uL (ref 3.4–10.8)

## 2017-06-08 LAB — BASIC METABOLIC PANEL
BUN/Creatinine Ratio: 16 (ref 10–24)
BUN: 16 mg/dL (ref 8–27)
CO2: 25 mmol/L (ref 20–29)
Calcium: 9.2 mg/dL (ref 8.6–10.2)
Chloride: 99 mmol/L (ref 96–106)
Creatinine, Ser: 1.03 mg/dL (ref 0.76–1.27)
GFR calc Af Amer: 90 mL/min/{1.73_m2} (ref >59)
GFR calc non Af Amer: 78 mL/min/{1.73_m2} (ref >59)
Glucose: 114 mg/dL — ABNORMAL HIGH (ref 65–99)
Potassium: 3.6 mmol/L (ref 3.5–5.2)
Sodium: 141 mmol/L (ref 134–144)

## 2017-06-08 LAB — LIPID PANEL
Chol/HDL Ratio: 4.5 ratio (ref 0.0–5.0)
Cholesterol, Total: 162 mg/dL (ref 100–199)
HDL: 36 mg/dL — ABNORMAL LOW (ref >39)
LDL Calculated: 103 mg/dL — ABNORMAL HIGH (ref 0–99)
Triglycerides: 117 mg/dL (ref 0–149)
VLDL Cholesterol Cal: 23 mg/dL (ref 5–40)

## 2017-06-08 LAB — HEPATIC FUNCTION PANEL
ALT: 24 IU/L (ref 0–44)
AST: 18 IU/L (ref 0–40)
Albumin: 4.3 g/dL (ref 3.6–4.8)
Alkaline Phosphatase: 82 IU/L (ref 39–117)
Bilirubin Total: 0.5 mg/dL (ref 0.0–1.2)
Bilirubin, Direct: 0.12 mg/dL (ref 0.00–0.40)
Total Protein: 6.9 g/dL (ref 6.0–8.5)

## 2017-06-08 LAB — PSA: Prostate Specific Ag, Serum: 0.5 ng/mL (ref 0.0–4.0)

## 2017-06-10 ENCOUNTER — Encounter: Payer: BLUE CROSS/BLUE SHIELD | Admitting: Family Medicine

## 2017-06-14 ENCOUNTER — Encounter: Payer: Self-pay | Admitting: Family Medicine

## 2017-06-14 ENCOUNTER — Ambulatory Visit: Payer: BLUE CROSS/BLUE SHIELD | Admitting: Family Medicine

## 2017-06-14 VITALS — BP 118/74 | Ht 68.5 in | Wt 270.0 lb

## 2017-06-14 DIAGNOSIS — Z0001 Encounter for general adult medical examination with abnormal findings: Secondary | ICD-10-CM | POA: Diagnosis not present

## 2017-06-14 DIAGNOSIS — E785 Hyperlipidemia, unspecified: Secondary | ICD-10-CM

## 2017-06-14 DIAGNOSIS — Z1211 Encounter for screening for malignant neoplasm of colon: Secondary | ICD-10-CM | POA: Diagnosis not present

## 2017-06-14 DIAGNOSIS — Z0000 Encounter for general adult medical examination without abnormal findings: Secondary | ICD-10-CM

## 2017-06-14 DIAGNOSIS — K219 Gastro-esophageal reflux disease without esophagitis: Secondary | ICD-10-CM

## 2017-06-14 DIAGNOSIS — R7303 Prediabetes: Secondary | ICD-10-CM

## 2017-06-14 DIAGNOSIS — Z79899 Other long term (current) drug therapy: Secondary | ICD-10-CM | POA: Diagnosis not present

## 2017-06-14 DIAGNOSIS — Z125 Encounter for screening for malignant neoplasm of prostate: Secondary | ICD-10-CM | POA: Diagnosis not present

## 2017-06-14 DIAGNOSIS — I10 Essential (primary) hypertension: Secondary | ICD-10-CM | POA: Diagnosis not present

## 2017-06-14 LAB — POCT GLYCOSYLATED HEMOGLOBIN (HGB A1C): Hemoglobin A1C: 5.2

## 2017-06-14 MED ORDER — PRAVASTATIN SODIUM 20 MG PO TABS: 20.0000 mg | ORAL_TABLET | Freq: Every day | ORAL | 1 refills | Status: DC

## 2017-06-14 MED ORDER — PANTOPRAZOLE SODIUM 40 MG PO TBEC: 40.0000 mg | DELAYED_RELEASE_TABLET | Freq: Every day | ORAL | 3 refills | Status: DC

## 2017-06-14 MED ORDER — LOSARTAN POTASSIUM-HCTZ 100-25 MG PO TABS: 1.0000 | ORAL_TABLET | Freq: Every day | ORAL | 3 refills | Status: DC

## 2017-06-14 NOTE — Addendum Note (Signed)
Addended by: Margaretha SheffieldBROWN, Aideliz Garmany S on: 06/14/2017 02:45 PM   Modules accepted: Orders

## 2017-06-14 NOTE — Progress Notes (Signed)
Subjective:    Patient ID: Andrew JacobsonDavid W Manning, male    DOB: June 21, 1955, 62 y.o.   MRN: [id number]  HPI The patient comes in today for a wellness visit.    A review of their health history was completed.  A review of medications was also completed.  Any needed refills; 90 day of losartan-hctz  Eating habits: not health conscious  Falls/  MVA accidents in past few months: none  Regular exercise: walking  Specialist pt sees on regular basis: Dr. Charise Killianotter - eyes  Preventative health issues were discussed.   Additional concerns: none  Results for orders placed or performed in visit on 06/14/17  POCT glycosylated hemoglobin (Hb A1C)  Result Value Ref Range   Hemoglobin A1C 5.2    We talked about his blood pressure the importance of keeping salt to a minimum and avoiding excessive salt use and exercising We talked about his glucose being slightly elevated his A1c looked good We talked about obesity and the importance of exercising bring this down Talked about his cholesterol as well and healthy diet   Review of Systems  Constitutional: Negative for activity change, appetite change and fever.  HENT: Negative for congestion and rhinorrhea.   Eyes: Negative for discharge.  Respiratory: Negative for cough and wheezing.   Cardiovascular: Negative for chest pain.  Gastrointestinal: Negative for abdominal pain, blood in stool and vomiting.  Genitourinary: Negative for difficulty urinating and frequency.  Musculoskeletal: Negative for neck pain.  Skin: Negative for rash.  Allergic/Immunologic: Negative for environmental allergies and food allergies.  Neurological: Negative for weakness and headaches.  Psychiatric/Behavioral: Negative for agitation.       Objective:   Physical Exam  Constitutional: He appears well-developed and well-nourished.  HENT:  Head: Normocephalic and atraumatic.  Right Ear: External ear normal.  Left Ear: External ear normal.  Nose: Nose normal.    Mouth/Throat: Oropharynx is clear and moist.  Eyes: EOM are normal. Pupils are equal, round, and reactive to light.  Neck: Normal range of motion. Neck supple. No thyromegaly present.  Cardiovascular: Normal rate, regular rhythm and normal heart sounds.  No murmur heard. Pulmonary/Chest: Effort normal and breath sounds normal. No respiratory distress. He has no wheezes.  Abdominal: Soft. Bowel sounds are normal. He exhibits no distension and no mass. There is no tenderness.  Genitourinary: Penis normal.  Musculoskeletal: Normal range of motion. He exhibits no edema.  Lymphadenopathy:    He has no cervical adenopathy.  Neurological: He is alert. He exhibits normal muscle tone.  Skin: Skin is warm and dry. No erythema.  Psychiatric: He has a normal mood and affect. His behavior is normal. Judgment normal.          Assessment & Plan:  Adult wellness-complete.wellness physical was conducted today. Importance of diet and exercise were discussed in detail. In addition to this a discussion regarding safety was also covered. We also reviewed over immunizations and gave recommendations regarding current immunization needed for age. In addition to this additional areas were also touched on including: Preventative health exams needed: Colonoscopy he is due in 2018 therefore we will send referral and have them connect with the patient  Patient was advised yearly wellness exam mild obesity patient is to watch diet try to stay physically active try to lose weight  Prostate normal  Hypertension good control continue current measures minimize salt in the diet continue medication  Hyperlipidemia his risk for heart disease is at 11% he is open to starting low-dose cholesterol  medicine we will start with pravastatin 20 mg daily we will recheck this again in 8-12 weeks side effects were discussed with patient  Patient should follow-up in 6 months we did go over his lab work which does show fasting  glucose being elevated but his A1c today is good

## 2017-06-14 NOTE — Progress Notes (Signed)
GI referral for colonoscopy ordered in Epic.

## 2017-06-15 ENCOUNTER — Encounter: Payer: Self-pay | Admitting: Family Medicine

## 2017-07-07 ENCOUNTER — Ambulatory Visit (INDEPENDENT_AMBULATORY_CARE_PROVIDER_SITE_OTHER): Payer: Self-pay

## 2017-07-07 DIAGNOSIS — Z1211 Encounter for screening for malignant neoplasm of colon: Secondary | ICD-10-CM

## 2017-07-07 NOTE — Progress Notes (Signed)
Gastroenterology Pre-Procedure Review  Request Date:07/07/17 Requesting Physician: Dr.Luking ( last tcs 05/12/07 RMR normal tcs)  PATIENT REVIEW QUESTIONS: The patient responded to the following health history questions as indicated:    1. Diabetes Melitis: no 2. Joint replacements in the past 12 months: no 3. Major health problems in the past 3 months: no 4. Has an artificial valve or MVP: no 5. Has a defibrillator: no 6. Has been advised in past to take antibiotics in advance of a procedure like teeth cleaning: no 7. Family history of colon cancer: no  8. Alcohol Use: no 9. History of sleep apnea: no  10. History of coronary artery or other vascular stents placed within the last 12 months: no 11. History of any prior anesthesia complications: yes (nausea and vomiting)    MEDICATIONS & ALLERGIES:    Patient reports the following regarding taking any blood thinners:   Plavix? no Aspirin? yes (81mg ) Coumadin? no Brilinta? no Xarelto? no Eliquis? no Pradaxa? no Savaysa? no Effient? no  Patient confirms/reports the following medications:  Current Outpatient Medications  Medication Sig Dispense Refill  . aspirin EC 81 MG tablet Take 81 mg by mouth daily.      Marland Kitchen. glucosamine-chondroitin 500-400 MG tablet Take 1 tablet by mouth daily.     Marland Kitchen. loratadine (CLARITIN) 10 MG tablet Take 10 mg by mouth daily.    Marland Kitchen. losartan-hydrochlorothiazide (HYZAAR) 100-25 MG tablet Take 1 tablet by mouth daily. 90 tablet 3  . pantoprazole (PROTONIX) 40 MG tablet Take 1 tablet (40 mg total) by mouth daily. 90 tablet 3  . pravastatin (PRAVACHOL) 20 MG tablet Take 1 tablet (20 mg total) by mouth daily. 90 tablet 1   No current facility-administered medications for this visit.     Patient confirms/reports the following allergies:  No Known Allergies  No orders of the defined types were placed in this encounter.   AUTHORIZATION INFORMATION Primary [place]  [place]: [id number] Pre-Cert /  Berkley HarveyAuth required: no  SCHEDULE INFORMATION: Procedure has been scheduled as follows:  Date: 08/03/17, Time: 10:30 Location: [facility] Dr.Rourk  This Gastroenterology Pre-Precedure Review Form is being routed to the following provider(s): Tana CoastLeslie Lewis PA-C

## 2017-07-08 MED ORDER — PEG 3350-KCL-NA BICARB-NACL 420 G PO SOLR: 4000.0000 mL | ORAL | 0 refills | Status: DC

## 2017-07-08 NOTE — Progress Notes (Signed)
Ok to schedule.

## 2017-07-08 NOTE — Patient Instructions (Signed)
Andrew Manning   Jun 03, 1955 MRN: [id number]    Procedure Date: 08/03/17 Time to register: 9:30 Place to register: [facility] Procedure Time: 10:30 Scheduled provider: [place] WITH [facility]  Please notify us immediately if you are diabetic, take iron supplements, or if you are on Coumadin or any other blood thinners.     You will need to purchase 1 fleet enema and 1 box of Bisacodyl '5mg'$  tablets.   2 DAYS BEFORE PROCEDURE:  DATE: 08/01/17   DAY: Monday Begin clear liquid diet AFTER your lunch meal. NO SOLID FOODS after this point.  1 DAY BEFORE PROCEDURE:  DATE: 08/02/17  DAY: Tuesday Continue clear liquids the entire day - NO SOLID FOOD.     At 2:00 pm:  Take 2 Bisacodyl tablets.   At 4:00pm:  Start drinking your solution. Make sure you mix well per instructions on the bottle. Try to drink 1 (one) 8 ounce glass every 10-15 minutes until you have consumed HALF the jug. You should complete by 6:00pm.You must keep the left over solution refrigerated until completed next day.  Continue clear liquids. You must drink plenty of clear liquids to prevent dehyration and kidney failure. Nothing to eat or drink after midnight.  EXCEPTION: If you take medications for your heart, blood pressure or breathing, you may take these medications with a small amount of clear liquid.    DAY OF PROCEDURE:   DATE: 08/03/17   DAY: Wednesday    Five hours before your procedure time @ 5:30am:  Finish remaining amout of bowel prep, drinking 1 (one) 8 ounce glass every 10-15 minutes until complete. You have two hours to consume remaining prep.   Three hours before your procedure time '@7'$ :30am:  Nothing by mouth.   At least one hour before going to the hospital:  Give yourself one Fleet enema. You may take your morning medications with sip of water unless we have instructed otherwise.      Please see below for Dietary Information.  CLEAR LIQUIDS  INCLUDE:  Water Jello (NOT red in color)   Ice Popsicles (NOT red in color)   Tea (sugar ok, no milk/cream) Powdered fruit flavored drinks  Coffee (sugar ok, no milk/cream) Gatorade/ Lemonade/ Kool-Aid  (NOT red in color)   Juice: apple, white grape, white cranberry Soft drinks  Clear bullion, consomme, broth (fat free beef/chicken/vegetable)  Carbonated beverages (any kind)  Strained chicken noodle soup Hard Candy   Remember: Clear liquids are liquids that will allow you to see your fingers on the other side of a clear glass. Be sure liquids are NOT red in color, and not cloudy, but CLEAR.  DO NOT EAT OR DRINK ANY OF THE FOLLOWING:  Dairy products of any kind   Cranberry juice Tomato juice / V8 juice   Grapefruit juice Orange juice     Red grape juice  Do not eat any solid foods, including such foods as: cereal, oatmeal, yogurt, fruits, vegetables, creamed soups, eggs, bread, crackers, pureed foods in a blender, etc.   HELPFUL HINTS FOR DRINKING PREP SOLUTION:   Make sure prep is extremely cold. Mix and refrigerate the the morning of the prep. You may also put in the freezer.   You may try mixing some Crystal Light or Country Time Lemonade if you prefer. Mix in small amounts; add more if necessary.  Try drinking through a straw  Rinse mouth with water or a mouthwash between glasses, to remove  after-taste.  Try sipping on a cold beverage /ice/ popsicles between glasses of prep.  Place a piece of sugar-free hard candy in mouth between glasses.  If you become nauseated, try consuming smaller amounts, or stretch out the time between glasses. Stop for 30-60 minutes, then slowly start back drinking.        OTHER INSTRUCTIONS  You will need a responsible adult at least 62 years of age to accompany you and drive you home. This person must remain in the waiting room during your procedure. The hospital will cancel your procedure if you do not have a responsible adult with you.    1. Wear loose fitting clothing that is easily removed. 2. Leave jewelry and other valuables at home.  3. Remove all body piercing jewelry and leave at home. 4. Total time from sign-in until discharge is approximately 2-3 hours. 5. You should go home directly after your procedure and rest. You can resume normal activities the day after your procedure. 6. The day of your procedure you should not:  Drive  Make legal decisions  Operate machinery  Drink alcohol  Return to work   You may call the office (Dept: [phone]) before 5:00pm, or page the doctor on call [phone]) after 5:00pm, for further instructions, if necessary.   Insurance Information YOU WILL NEED TO CHECK WITH YOUR INSURANCE COMPANY FOR THE BENEFITS OF COVERAGE YOU HAVE FOR THIS PROCEDURE.  UNFORTUNATELY, NOT ALL INSURANCE COMPANIES HAVE BENEFITS TO COVER ALL OR PART OF THESE TYPES OF PROCEDURES.  IT IS YOUR RESPONSIBILITY TO CHECK YOUR BENEFITS, HOWEVER, WE WILL BE GLAD TO ASSIST YOU WITH ANY CODES YOUR INSURANCE COMPANY MAY NEED.    PLEASE NOTE THAT MOST INSURANCE COMPANIES WILL NOT COVER A SCREENING COLONOSCOPY FOR PEOPLE UNDER THE AGE OF 50  IF YOU HAVE BCBS INSURANCE, YOU MAY HAVE BENEFITS FOR A SCREENING COLONOSCOPY BUT IF POLYPS ARE FOUND THE DIAGNOSIS WILL CHANGE AND THEN YOU MAY HAVE A DEDUCTIBLE THAT WILL NEED TO BE MET. SO PLEASE MAKE SURE YOU CHECK YOUR BENEFITS FOR A SCREENING COLONOSCOPY AS WELL AS A DIAGNOSTIC COLONOSCOPY.

## 2017-08-03 ENCOUNTER — Encounter (HOSPITAL_COMMUNITY): Payer: Self-pay | Admitting: *Deleted

## 2017-08-03 ENCOUNTER — Ambulatory Visit (HOSPITAL_COMMUNITY)
Admission: RE | Admit: 2017-08-03 | Discharge: 2017-08-03 | Disposition: A | Discharge status: Home / Self Care | Payer: BLUE CROSS/BLUE SHIELD | Source: Ambulatory Visit | Attending: Internal Medicine | Admitting: Internal Medicine

## 2017-08-03 ENCOUNTER — Encounter (HOSPITAL_COMMUNITY)
Admission: RE | Disposition: A | Discharge status: Home / Self Care | Payer: Self-pay | Source: Ambulatory Visit | Attending: Internal Medicine

## 2017-08-03 ENCOUNTER — Other Ambulatory Visit: Payer: Self-pay

## 2017-08-03 DIAGNOSIS — Z823 Family history of stroke: Secondary | ICD-10-CM | POA: Diagnosis not present

## 2017-08-03 DIAGNOSIS — K5730 Diverticulosis of large intestine without perforation or abscess without bleeding: Secondary | ICD-10-CM | POA: Insufficient documentation

## 2017-08-03 DIAGNOSIS — Z8249 Family history of ischemic heart disease and other diseases of the circulatory system: Secondary | ICD-10-CM | POA: Diagnosis not present

## 2017-08-03 DIAGNOSIS — Z1211 Encounter for screening for malignant neoplasm of colon: Secondary | ICD-10-CM | POA: Diagnosis not present

## 2017-08-03 DIAGNOSIS — K219 Gastro-esophageal reflux disease without esophagitis: Secondary | ICD-10-CM | POA: Insufficient documentation

## 2017-08-03 DIAGNOSIS — Z7982 Long term (current) use of aspirin: Secondary | ICD-10-CM | POA: Insufficient documentation

## 2017-08-03 DIAGNOSIS — I10 Essential (primary) hypertension: Secondary | ICD-10-CM | POA: Insufficient documentation

## 2017-08-03 DIAGNOSIS — Z801 Family history of malignant neoplasm of trachea, bronchus and lung: Secondary | ICD-10-CM | POA: Diagnosis not present

## 2017-08-03 DIAGNOSIS — Z79899 Other long term (current) drug therapy: Secondary | ICD-10-CM | POA: Insufficient documentation

## 2017-08-03 DIAGNOSIS — Z8049 Family history of malignant neoplasm of other genital organs: Secondary | ICD-10-CM | POA: Diagnosis not present

## 2017-08-03 DIAGNOSIS — E7800 Pure hypercholesterolemia, unspecified: Secondary | ICD-10-CM | POA: Diagnosis not present

## 2017-08-03 DIAGNOSIS — Z1212 Encounter for screening for malignant neoplasm of rectum: Secondary | ICD-10-CM

## 2017-08-03 HISTORY — DX: Pure hypercholesterolemia, unspecified: E78.00

## 2017-08-03 HISTORY — DX: Other seasonal allergic rhinitis: J30.2

## 2017-08-03 HISTORY — PX: COLONOSCOPY: SHX5424

## 2017-08-03 SURGERY — COLONOSCOPY
Anesthesia: Moderate Sedation

## 2017-08-03 MED ORDER — MIDAZOLAM HCL 5 MG/5ML IJ SOLN
INTRAMUSCULAR | Status: DC | PRN
  Administered 2017-08-03 (×2): 2 mg via INTRAVENOUS
  Administered 2017-08-03: 1 mg via INTRAVENOUS

## 2017-08-03 MED ORDER — SODIUM CHLORIDE 0.9 % IV SOLN
INTRAVENOUS | Status: DC
  Administered 2017-08-03: 10:00:00 via INTRAVENOUS

## 2017-08-03 MED ORDER — STERILE WATER FOR IRRIGATION IR SOLN
Status: DC | PRN
  Administered 2017-08-03: 2.5 mL

## 2017-08-03 MED ORDER — ONDANSETRON HCL 4 MG/2ML IJ SOLN
INTRAMUSCULAR | Status: AC
  Filled 2017-08-03: qty 2

## 2017-08-03 MED ORDER — MEPERIDINE HCL 100 MG/ML IJ SOLN
INTRAMUSCULAR | Status: DC | PRN
  Administered 2017-08-03: 50 mg via INTRAVENOUS

## 2017-08-03 MED ORDER — MEPERIDINE HCL 100 MG/ML IJ SOLN
INTRAMUSCULAR | Status: AC
  Filled 2017-08-03: qty 2

## 2017-08-03 MED ORDER — ONDANSETRON HCL 4 MG/2ML IJ SOLN
INTRAMUSCULAR | Status: DC | PRN
  Administered 2017-08-03: 4 mg via INTRAVENOUS

## 2017-08-03 MED ORDER — MIDAZOLAM HCL 5 MG/5ML IJ SOLN
INTRAMUSCULAR | Status: AC
  Filled 2017-08-03: qty 10

## 2017-08-03 NOTE — Op Note (Signed)
[facility] Patient Name: Andrew Manning Procedure Date: 08/03/2017 9:35 AM MRN: [id number] Date of Birth: January 10, 1956 Attending MD: Gennette Pac , MD CSN: [id number] Age: 62 Admit Type: Outpatient Procedure:                Colonoscopy Indications:              Screening for colorectal malignant neoplasm Providers:                Gennette Pac, MD, Nena Polio, RN, Dyann Ruddle Referring MD:              Medicines:                Midazolam 5 mg IV, Meperidine 50 mg IV Complications:            No immediate complications. Estimated Blood Loss:     Estimated blood loss: none. Procedure:                Pre-Anesthesia Assessment:                           - Prior to the procedure, a History and Physical                            was performed, and patient medications and                            allergies were reviewed. The patient's tolerance of                            previous anesthesia was also reviewed. The risks                            and benefits of the procedure and the sedation                            options and risks were discussed with the patient.                            All questions were answered, and informed consent                            was obtained. Prior Anticoagulants: The patient has                            taken no previous anticoagulant or antiplatelet                            agents. ASA Grade Assessment: II - A patient with                            mild systemic disease. After reviewing the risks  and benefits, the patient was deemed in                            satisfactory condition to undergo the procedure.                           After obtaining informed consent, the colonoscope                            was passed under direct vision. Throughout the                            procedure, the patient's blood pressure, pulse, and   oxygen saturations were monitored continuously. The                            EC-[id number] ([id number] scope was introduced through                            the anus and advanced to the the cecum, identified                            by appendiceal orifice and ileocecal valve. The                            colonoscopy was performed without difficulty. The                            patient tolerated the procedure well. The entire                            colon was well visualized. The ileocecal valve,                            appendiceal orifice, and rectum were photographed.                            The quality of the bowel preparation was adequate. Scope In: 9:57:29 AM Scope Out: 10:07:43 AM Scope Withdrawal Time: 0 hours 7 minutes 38 seconds  Total Procedure Duration: 0 hours 10 minutes 14 seconds  Findings:      The perianal and digital rectal examinations were normal.      Scattered medium-mouthed diverticula were found in the sigmoid colon and       descending colon.      The exam was otherwise without abnormality on direct and retroflexion       views. Impression:               - Diverticulosis in the sigmoid colon and in the                            descending colon.                           - The examination was otherwise normal on direct  and retroflexion views.                           - No specimens collected. Moderate Sedation:      Moderate (conscious) sedation was administered by the endoscopy nurse       and supervised by the endoscopist. The following parameters were       monitored: oxygen saturation, heart rate, blood pressure, respiratory       rate, EKG, adequacy of pulmonary ventilation, and response to care.       Total physician intraservice time was 14 minutes. Recommendation:           - Patient has a contact number available for                            emergencies. The signs and symptoms of potential                             delayed complications were discussed with the                            patient. Return to normal activities tomorrow.                            Written discharge instructions were provided to the                            patient.                           - Resume previous diet.                           - Continue present medications.                           - Await pathology results.                           - Repeat colonoscopy in 10 years for screening                            purposes.                           - Return to GI clinic (date not yet determined). Procedure Code(s):        --- Professional ---                           [phone], Colonoscopy, flexible; diagnostic, including                            collection of specimen(s) by brushing or washing,                            when performed (separate procedure)  40981, Moderate sedation services provided by the                            same physician or other qualified health care                            professional performing the diagnostic or                            therapeutic service that the sedation supports,                            requiring the presence of an independent trained                            observer to assist in the monitoring of the                            patient's level of consciousness and physiological                            status; initial 15 minutes of intraservice time,                            patient age 62 years or older Diagnosis Code(s):        --- Professional ---                           Z12.11, Encounter for screening for malignant                            neoplasm of colon                           K57.30, Diverticulosis of large intestine without                            perforation or abscess without bleeding CPT copyright 2016 American Medical Association. All rights reserved. The codes documented in this report are  preliminary and upon coder review may  be revised to meet current compliance requirements. Gerrit Friends. Rourk, MD Gennette Pac, MD 08/03/2017 10:16:52 AM This report has been signed electronically. Number of Addenda: 0

## 2017-08-03 NOTE — H&P (Signed)
 @LOGO @   Primary Care Physician:  Andrew SciaraLuking, Andrew A, MD Primary Gastroenterologist:  Dr. Jena Gaussourk  Pre-Procedure History & Physical: HPI:  Andrew Manning is Manning 62 y.o. male here for Manning screening colonoscopy. Colonoscopy 2008. No family history of colon cancer. No bowel symptoms.  Past Medical History:  Diagnosis Date  . Cellulitis   . GERD (gastroesophageal reflux disease)   . Hypercholesteremia   . Hypertension   . Seasonal allergies     Past Surgical History:  Procedure Laterality Date  . CERVICAL FUSION    . KNEE ARTHROSCOPY    . lipoma removal     from back  . SHOULDER ARTHROSCOPY      Prior to Admission medications   Medication Sig Start Date End Date Taking? Authorizing Provider  aspirin EC 81 MG tablet Take 81 mg by mouth daily.     Yes [provider]  glucosamine-chondroitin 500-400 MG tablet Take 1 tablet by mouth daily.    Yes [provider]  loratadine (CLARITIN) 10 MG tablet Take 10 mg by mouth daily.   Yes [provider]  losartan-hydrochlorothiazide (HYZAAR) 100-25 MG tablet Take 1 tablet by mouth daily. 06/14/17  Yes Manning, Andrew CoupScott A, MD  pantoprazole (PROTONIX) 40 MG tablet Take 1 tablet (40 mg total) by mouth daily. 06/14/17  Yes Andrew SciaraLuking, Andrew A, MD  pravastatin (PRAVACHOL) 20 MG tablet Take 1 tablet (20 mg total) by mouth daily. 06/14/17  Yes Andrew SciaraLuking, Andrew A, MD    Allergies as of 07/08/2017  . (No Known Allergies)    Family History  Problem Relation Age of Onset  . Cancer Mother        uterine  . Cancer Father        lung cancer  . Hypertension Father   . Hypertension Sister   . Hypertension Brother   . Stroke Paternal Grandfather   . Colon cancer Neg Hx     Social History   Socioeconomic History  . Marital status: Married    Spouse name: Not on file  . Number of children: Not on file  . Years of education: Not on file  . Highest education level: Not on file  Social Needs  . Financial resource strain: Not on file   . Food insecurity - worry: Not on file  . Food insecurity - inability: Not on file  . Transportation needs - medical: Not on file  . Transportation needs - non-medical: Not on file  Occupational History  . Not on file  Tobacco Use  . Smoking status: Never Smoker  . Smokeless tobacco: Never Used  Substance and Sexual Activity  . Alcohol use: No  . Drug use: No  . Sexual activity: Yes  Other Topics Concern  . Not on file  Social History Narrative  . Not on file    Review of Systems: See HPI, otherwise negative ROS  Physical Exam: BP 140/80   Pulse 77   Temp 97.6 F (36.4 C) (Oral)   Resp 18   Ht 5\' 9"  (1.753 m)   Wt 250 lb (113.4 kg)   SpO2 98%   BMI 36.92 kg/m  General:   Alert,  Well-developed, well-nourished, pleasant and cooperative in NAD SNeck:  Supple; no masses or thyromegaly. No significant cervical adenopathy. Lungs:  Clear throughout to auscultation.   No wheezes, crackles, or rhonchi. No acute distress. Heart:  Regular rate and rhythm; no murmurs, clicks, rubs,  or gallops. Abdomen: Non-distended, normal bowel sounds.  Soft and  nontender without appreciable mass or hepatosplenomegaly.  Pulses:  Normal pulses noted. Extremities:  Without clubbing or edema.  Impression:  Pleasant 62 year old woman here for screening colonoscopy. Average risk screening examination.  Recommendations: I have offered patient the patient Manning screening colonoscopy today.  The risks, benefits, limitations, alternatives and imponderables have been reviewed with the patient. Questions have been answered. All parties are agreeable.    Notice: This dictation was prepared with Dragon dictation along with smaller phrase technology. Any transcriptional errors that result from this process are unintentional and may not be corrected upon review.

## 2017-08-03 NOTE — Discharge Instructions (Signed)
Colonoscopy Discharge Instructions  Read the instructions outlined below and refer to this sheet in the next few weeks. These discharge instructions provide you with general information on caring for yourself after you leave the hospital. Your doctor may also give you specific instructions. While your treatment has been planned according to the most current medical practices available, unavoidable complications occasionally occur. If you have any problems or questions after discharge, call Dr. Jena Gauss at [phone]. ACTIVITY  You may resume your regular activity, but move at a slower pace for the next 24 hours.   Take frequent rest periods for the next 24 hours.   Walking will help get rid of the air and reduce the bloated feeling in your belly (abdomen).   No driving for 24 hours (because of the medicine (anesthesia) used during the test).    Do not sign any important legal documents or operate any machinery for 24 hours (because of the anesthesia used during the test).  NUTRITION  Drink plenty of fluids.   You may resume your normal diet as instructed by your doctor.   Begin with a light meal and progress to your normal diet. Heavy or fried foods are harder to digest and may make you feel sick to your stomach (nauseated).   Avoid alcoholic beverages for 24 hours or as instructed.  MEDICATIONS  You may resume your normal medications unless your doctor tells you otherwise.  WHAT YOU CAN EXPECT TODAY  Some feelings of bloating in the abdomen.   Passage of more gas than usual.   Spotting of blood in your stool or on the toilet paper.  IF YOU HAD POLYPS REMOVED DURING THE COLONOSCOPY:  No aspirin products for 7 days or as instructed.   No alcohol for 7 days or as instructed.   Eat a soft diet for the next 24 hours.  FINDING OUT THE RESULTS OF YOUR TEST Not all test results are available during your visit. If your test results are not back during the visit, make an appointment  with your caregiver to find out the results. Do not assume everything is normal if you have not heard from your caregiver or the medical facility. It is important for you to follow up on all of your test results.  SEEK IMMEDIATE MEDICAL ATTENTION IF:  You have more than a spotting of blood in your stool.   Your belly is swollen (abdominal distention).   You are nauseated or vomiting.   You have a temperature over 101.   You have abdominal pain or discomfort that is severe or gets worse throughout the day.    Diverticulosis information provided  Return for 1 more screening colonoscopy in 10 years     Diverticulosis Diverticulosis is a condition that develops when small pouches (diverticula) form in the wall of the large intestine (colon). The colon is where water is absorbed and stool is formed. The pouches form when the inside layer of the colon pushes through weak spots in the outer layers of the colon. You may have a few pouches or many of them. What are the causes? The cause of this condition is not known. What increases the risk? The following factors may make you more likely to develop this condition:  Being older than age 64. Your risk for this condition increases with age. Diverticulosis is rare among people younger than age 86. By age 51, many people have it.  Eating a low-fiber diet.  Having frequent constipation.  Being overweight.  Not getting enough exercise.  Smoking.  Taking over-the-counter pain medicines, like aspirin and ibuprofen.  Having a family history of diverticulosis.  What are the signs or symptoms? In most people, there are no symptoms of this condition. If you do have symptoms, they may include:  Bloating.  Cramps in the abdomen.  Constipation or diarrhea.  Pain in the lower left side of the abdomen.  How is this diagnosed? This condition is most often diagnosed during an exam for other colon problems. Because diverticulosis usually  has no symptoms, it often cannot be diagnosed independently. This condition may be diagnosed by:  Using a flexible scope to examine the colon (colonoscopy).  Taking an X-ray of the colon after dye has been put into the colon (barium enema).  Doing a CT scan.  How is this treated? You may not need treatment for this condition if you have never developed an infection related to diverticulosis. If you have had an infection before, treatment may include:  Eating a high-fiber diet. This may include eating more fruits, vegetables, and grains.  Taking a fiber supplement.  Taking a live bacteria supplement (probiotic).  Taking medicine to relax your colon.  Taking antibiotic medicines.  Follow these instructions at home:  Drink 6-8 glasses of water or more each day to prevent constipation.  Try not to strain when you have a bowel movement.  If you have had an infection before: ? Eat more fiber as directed by your health care provider or your diet and nutrition specialist (dietitian). ? Take a fiber supplement or probiotic, if your health care provider approves.  Take over-the-counter and prescription medicines only as told by your health care provider.  If you were prescribed an antibiotic, take it as told by your health care provider. Do not stop taking the antibiotic even if you start to feel better.  Keep all follow-up visits as told by your health care provider. This is important. Contact a health care provider if:  You have pain in your abdomen.  You have bloating.  You have cramps.  You have not had a bowel movement in 3 days. Get help right away if:  Your pain gets worse.  Your bloating becomes very bad.  You have a fever or chills, and your symptoms suddenly get worse.  You vomit.  You have bowel movements that are bloody or black.  You have bleeding from your rectum. Summary  Diverticulosis is a condition that develops when small pouches (diverticula) form  in the wall of the large intestine (colon).  You may have a few pouches or many of them.  This condition is most often diagnosed during an exam for other colon problems.  If you have had an infection related to diverticulosis, treatment may include increasing the fiber in your diet, taking supplements, or taking medicines. This information is not intended to replace advice given to you by your health care provider. Make sure you discuss any questions you have with your health care provider. Document Released: 02/12/2004 Document Revised: 04/05/2016 Document Reviewed: 04/05/2016 Elsevier Interactive Patient Education  2017 [employer]

## 2017-08-08 ENCOUNTER — Encounter (HOSPITAL_COMMUNITY): Payer: Self-pay | Admitting: Internal Medicine

## 2017-08-30 DIAGNOSIS — L570 Actinic keratosis: Secondary | ICD-10-CM | POA: Diagnosis not present

## 2017-08-30 DIAGNOSIS — Z85828 Personal history of other malignant neoplasm of skin: Secondary | ICD-10-CM | POA: Diagnosis not present

## 2017-08-30 DIAGNOSIS — D485 Neoplasm of uncertain behavior of skin: Secondary | ICD-10-CM | POA: Diagnosis not present

## 2017-08-30 DIAGNOSIS — L821 Other seborrheic keratosis: Secondary | ICD-10-CM | POA: Diagnosis not present

## 2017-09-19 ENCOUNTER — Encounter (HOSPITAL_COMMUNITY): Payer: Self-pay | Admitting: *Deleted

## 2017-09-19 ENCOUNTER — Encounter: Payer: Self-pay | Admitting: Family Medicine

## 2017-09-19 ENCOUNTER — Emergency Department (HOSPITAL_COMMUNITY)
Admission: EM | Admit: 2017-09-19 | Discharge: 2017-09-20 | Disposition: A | Discharge status: Home / Self Care | Payer: BLUE CROSS/BLUE SHIELD | Attending: Emergency Medicine | Admitting: Emergency Medicine

## 2017-09-19 ENCOUNTER — Emergency Department (HOSPITAL_COMMUNITY): Payer: BLUE CROSS/BLUE SHIELD

## 2017-09-19 ENCOUNTER — Ambulatory Visit: Payer: BLUE CROSS/BLUE SHIELD | Admitting: Family Medicine

## 2017-09-19 ENCOUNTER — Other Ambulatory Visit: Payer: Self-pay

## 2017-09-19 VITALS — BP 118/72 | Ht 68.5 in | Wt 270.0 lb

## 2017-09-19 DIAGNOSIS — Z79899 Other long term (current) drug therapy: Secondary | ICD-10-CM | POA: Insufficient documentation

## 2017-09-19 DIAGNOSIS — R42 Dizziness and giddiness: Secondary | ICD-10-CM | POA: Insufficient documentation

## 2017-09-19 DIAGNOSIS — Z7982 Long term (current) use of aspirin: Secondary | ICD-10-CM | POA: Insufficient documentation

## 2017-09-19 DIAGNOSIS — R51 Headache: Secondary | ICD-10-CM | POA: Insufficient documentation

## 2017-09-19 DIAGNOSIS — R519 Headache, unspecified: Secondary | ICD-10-CM

## 2017-09-19 DIAGNOSIS — I10 Essential (primary) hypertension: Secondary | ICD-10-CM | POA: Diagnosis not present

## 2017-09-19 DIAGNOSIS — I4510 Unspecified right bundle-branch block: Secondary | ICD-10-CM | POA: Diagnosis not present

## 2017-09-19 DIAGNOSIS — R112 Nausea with vomiting, unspecified: Secondary | ICD-10-CM | POA: Diagnosis not present

## 2017-09-19 LAB — CBC
HCT: 48.3 % (ref 39.0–52.0)
Hemoglobin: 16.7 g/dL (ref 13.0–17.0)
MCH: 30.5 pg (ref 26.0–34.0)
MCHC: 34.6 g/dL (ref 30.0–36.0)
MCV: 88.3 fL (ref 78.0–100.0)
Platelets: 295 10*3/uL (ref 150–400)
RBC: 5.47 MIL/uL (ref 4.22–5.81)
RDW: 13.1 % (ref 11.5–15.5)
WBC: 16.3 10*3/uL — ABNORMAL HIGH (ref 4.0–10.5)

## 2017-09-19 LAB — COMPREHENSIVE METABOLIC PANEL
ALT: 30 U/L (ref 17–63)
AST: 26 U/L (ref 15–41)
Albumin: 4.3 g/dL (ref 3.5–5.0)
Alkaline Phosphatase: 71 U/L (ref 38–126)
Anion gap: 14 (ref 5–15)
BUN: 10 mg/dL (ref 6–20)
CO2: 26 mmol/L (ref 22–32)
Calcium: 9.2 mg/dL (ref 8.9–10.3)
Chloride: 98 mmol/L — ABNORMAL LOW (ref 101–111)
Creatinine, Ser: 0.86 mg/dL (ref 0.61–1.24)
GFR calc Af Amer: >60 mL/min (ref >60)
GFR calc non Af Amer: >60 mL/min (ref >60)
Glucose, Bld: 131 mg/dL — ABNORMAL HIGH (ref 65–99)
Potassium: 3 mmol/L — ABNORMAL LOW (ref 3.5–5.1)
Sodium: 138 mmol/L (ref 135–145)
Total Bilirubin: 0.5 mg/dL (ref 0.3–1.2)
Total Protein: 7.7 g/dL (ref 6.5–8.1)

## 2017-09-19 LAB — LIPASE, BLOOD: Lipase: 31 U/L (ref 11–51)

## 2017-09-19 MED ORDER — DIPHENHYDRAMINE HCL 50 MG/ML IJ SOLN
25.0000 mg | Freq: Once | INTRAMUSCULAR | Status: AC
  Administered 2017-09-19: 25 mg via INTRAVENOUS
  Filled 2017-09-19: qty 1

## 2017-09-19 MED ORDER — MECLIZINE HCL 12.5 MG PO TABS
25.0000 mg | ORAL_TABLET | Freq: Once | ORAL | Status: AC
  Administered 2017-09-19: 25 mg via ORAL
  Filled 2017-09-19: qty 2

## 2017-09-19 MED ORDER — PROCHLORPERAZINE EDISYLATE 10 MG/2ML IJ SOLN
10.0000 mg | Freq: Once | INTRAMUSCULAR | Status: AC
  Administered 2017-09-19: 10 mg via INTRAVENOUS
  Filled 2017-09-19: qty 2

## 2017-09-19 MED ORDER — SODIUM CHLORIDE 0.9 % IV BOLUS
1000.0000 mL | Freq: Once | INTRAVENOUS | Status: AC
  Administered 2017-09-19: 1000 mL via INTRAVENOUS

## 2017-09-19 NOTE — Progress Notes (Signed)
   Subjective:    Patient ID: Andrew JacobsonDavid W Manning, male    DOB: 1956-03-15, 62 y.o.   MRN: [id number]  HPI  Patient arrives with right ear pain and vertigo with nausea. Patient states it started this morning. Patient has had mild congestion over the past couple weeks  No fever no chills.  Patient first reported popping sensation in the ear earlier this morning.  Based on this we agreed for him to see us later in the day  However by this evening symptoms have become more concerning.  Patient notes frontal/right temporal frontal headache.  Fairly severe in nature.  Of note patient generally does not get substantial headaches.  No confusion and thinking.  Very unsteady.  Recurrent vomiting.  Took some over-the-counter medicines for headache.  Long-standing history of hypertension.  Long-standing history of hyperlipidemia. Review of Systems No headache, no major weight loss or weight gain, no chest pain no back pain abdominal pain no change in bowel habits complete ROS otherwise negative     Objective:   Physical Exam  Alert and oriented, vitals reviewed and stable, NAD ENT-TM's and ext canals WNL bilat via otoscopic exam Soft palate, tonsils and post pharynx WNL via oropharyngeal exam Neck-symmetric, no masses; thyroid nonpalpable and nontender Pulmonary-no tachypnea or accessory muscle use; Clear without wheezes via auscultation Card--no abnrml murmurs, rhythm reg and rate WNL Carotid pulses symmetric, without bruits   No focal neurological deficits.  No nystagmus.  Plus minus recurrence noted with change in head position.  Neck baseline suppleness per patient complicated by history of cervical surgery  Intermittent severe vomiting during exam.    Assessment & Plan:  With multiple risk factors, severe headache, normal ear exam, and severe vertigo-like symptoms with active vomiting feel further work-up warranted.  Discussed with patient.  Discussed with the ER doctor.  Rationale discussed.   Potential for stroke discussed with patient

## 2017-09-19 NOTE — ED Triage Notes (Addendum)
Pt c/o dizziness, vomiting and headache that started at 0700 this morning. LKW at 0500. Denies diarrhea. Pt reports when he woke up this morning he felt a popping sensation in his right ear. Pt was seen by PCP today and had normal ear exam. Pt was sent to ED by PCP. Pt denies blurry vision, loss of vision. Face symmetrical. Grips equal. No arm drift. No slurred speech.  EDP notified of pt's symptoms at time of triage.

## 2017-09-19 NOTE — ED Provider Notes (Addendum)
[facility] EMERGENCY DEPARTMENT Provider Note   CSN: [id number] Arrival date & time: 09/19/17  1734     History   Chief Complaint Chief Complaint  Patient presents with  . Dizziness    HPI Andrew Manning is a 62 y.o. male.  HPI  This is a 62 year old male with a history of hypertension, hypercholesterolemia, reflux who presents with dizziness and headache.  Patient reports onset of dizziness and headache this morning.  He felt a pop in his right ear.  Since that time he reports that he has been experiencing a sharp headache.  He rates his pain a 10 out of 10.  He describes worst headache of his life.  Additionally, he reports room spinning dizziness and vomiting.  Nothing seems to make his symptoms better or worse.  He took 2 ibuprofen with minimal relief.  He has no history of vertigo.  He was seen by his primary physician and sent here for further evaluation.  Denies any recent illnesses or upper respiratory symptoms.  Denies any neck pain or fever.  Past Medical History:  Diagnosis Date  . Cellulitis   . GERD (gastroesophageal reflux disease)   . Hypercholesteremia   . Hypertension   . Seasonal allergies     Patient Active Problem List   Diagnosis Date Noted  . GERD (gastroesophageal reflux disease) 06/17/2015  . Class 2 obesity without serious comorbidity with body mass index (BMI) of 38.0 to 38.9 in adult 12/07/2013  . HTN (hypertension), benign 06/05/2013  . Hyperlipidemia 06/05/2013  . Hyperglycemia 06/05/2013    Past Surgical History:  Procedure Laterality Date  . CERVICAL FUSION    . COLONOSCOPY N/A 08/03/2017   Procedure: COLONOSCOPY;  Surgeon: Corbin Ade, MD;  Location: [facility];  Service: Endoscopy;  Laterality: N/A;  10:30  . KNEE ARTHROSCOPY    . lipoma removal     from back  . SHOULDER ARTHROSCOPY          Home Medications    Prior to Admission medications   Medication Sig Start Date End Date Taking? Authorizing Provider  aspirin  EC 81 MG tablet Take 81 mg by mouth daily.     Yes [provider]  glucosamine-chondroitin 500-400 MG tablet Take 1 tablet by mouth daily.    Yes [provider]  ibuprofen (ADVIL,MOTRIN) 200 MG tablet Take 400 mg by mouth daily as needed for mild pain or moderate pain.   Yes [provider]  loratadine (CLARITIN) 10 MG tablet Take 10 mg by mouth daily.   Yes [provider]  losartan-hydrochlorothiazide (HYZAAR) 100-25 MG tablet Take 1 tablet by mouth daily. 06/14/17  Yes Luking, Jonna Coup, MD  pantoprazole (PROTONIX) 40 MG tablet Take 1 tablet (40 mg total) by mouth daily. 06/14/17  Yes Babs Sciara, MD  pravastatin (PRAVACHOL) 20 MG tablet Take 1 tablet (20 mg total) by mouth daily. 06/14/17  Yes Babs Sciara, MD    Family History Family History  Problem Relation Age of Onset  . Cancer Mother        uterine  . Cancer Father        lung cancer  . Hypertension Father   . Hypertension Sister   . Hypertension Brother   . Stroke Paternal Grandfather   . Colon cancer Neg Hx     Social History Social History   Tobacco Use  . Smoking status: Never Smoker  . Smokeless tobacco: Never Used  Substance Use Topics  .  Alcohol use: No  . Drug use: No     Allergies   Patient has no known allergies.   Review of Systems Review of Systems  Constitutional: Negative for fever.  Respiratory: Negative for shortness of breath.   Cardiovascular: Negative for chest pain.  Gastrointestinal: Positive for nausea and vomiting. Negative for abdominal pain.  Neurological: Positive for dizziness and headaches.  All other systems reviewed and are negative.    Physical Exam Updated Vital Signs BP (!) 101/54   Pulse 63   Temp 98 F (36.7 C) (Oral)   Resp 19   Ht 5' 8.5" (1.74 m)   Wt 122.5 kg (270 lb)   SpO2 97%   BMI 40.46 kg/m   Physical Exam  Constitutional: He is oriented to person, place, and time. He appears well-developed and well-nourished.  No distress.  HENT:  Head: Normocephalic and atraumatic.  Eyes: Pupils are equal, round, and reactive to light.  Pupils 4 mm reactive bilaterally  nystagmus noted  Neck: Neck supple.  Cardiovascular: Normal rate, regular rhythm and normal heart sounds.  No murmur heard. Pulmonary/Chest: Effort normal and breath sounds normal. No respiratory distress. He has no wheezes.  Abdominal: Soft. Bowel sounds are normal. There is no tenderness. There is no rebound.  Musculoskeletal: He exhibits no edema.  Lymphadenopathy:    He has no cervical adenopathy.  Neurological: He is alert and oriented to person, place, and time.  Cranial nerves II through XII intact, 5 out of 5 strength in all 4 extremities, no dysmetria to finger-nose-finger, gait testing deferred  Skin: Skin is warm and dry.  Psychiatric: He has a normal mood and affect.  Nursing note and vitals reviewed.    ED Treatments / Results  Labs (all labs ordered are listed, but only abnormal results are displayed) Labs Reviewed  COMPREHENSIVE METABOLIC PANEL - Abnormal; Notable for the following components:      Result Value   Potassium 3.0 (*)    Chloride 98 (*)    Glucose, Bld 131 (*)    All other components within normal limits  CBC - Abnormal; Notable for the following components:   WBC 16.3 (*)    All other components within normal limits  CSF CULTURE  GRAM STAIN  LIPASE, BLOOD  URINALYSIS, ROUTINE W REFLEX MICROSCOPIC  CSF CELL COUNT WITH DIFFERENTIAL  CSF CELL COUNT WITH DIFFERENTIAL  GLUCOSE, CSF  PROTEIN, CSF    EKG EKG Interpretation  Date/Time:  Monday September 19 2017 18:38:03 EDT Ventricular Rate:  67 PR Interval:  166 QRS Duration: 104 QT Interval:  432 QTC Calculation: 456 R Axis:   8 Text Interpretation:  Normal sinus rhythm Incomplete right bundle branch block Borderline ECG increased conduction delay from prior 6/09 Confirmed by Meridee Score [phone]) on 09/19/2017 7:41:56 PM   Radiology Ct Head Wo  Contrast  Result Date: 09/20/2017 CLINICAL DATA:  Acute onset of dizziness, vomiting and headache. Felt popping sensation at the right ear. EXAM: CT HEAD WITHOUT CONTRAST TECHNIQUE: Contiguous axial images were obtained from the base of the skull through the vertex without intravenous contrast. COMPARISON:  None. FINDINGS: Brain: No evidence of acute infarction, hemorrhage, hydrocephalus, extra-axial collection or mass lesion/mass effect. Mild periventricular white matter change likely reflects small vessel ischemic microangiopathy. The posterior fossa, including the cerebellum, brainstem and fourth ventricle, is within normal limits. The third and lateral ventricles, and basal ganglia are unremarkable in appearance. The cerebral hemispheres are symmetric in appearance, with normal gray-white differentiation. No mass  effect or midline shift is seen. Vascular: No hyperdense vessel or unexpected calcification. Skull: There is no evidence of fracture; visualized osseous structures are unremarkable in appearance. Sinuses/Orbits: The orbits are within normal limits. A mucus retention cyst or polyp is noted at the right maxillary sinus. The remaining paranasal sinuses and mastoid air cells are well-aerated. Other: No significant soft tissue abnormalities are seen. IMPRESSION: 1. No acute intracranial pathology seen on CT. 2. Mild small vessel ischemic microangiopathy. 3. Mucus retention cyst or polyp at the right maxillary sinus. Electronically Signed   By: Roanna Raider M.D.   On: 09/20/2017 00:19    Procedures .Lumbar Puncture Date/Time: 09/20/2017 2:55 AM Performed by: Shon Baton, MD Authorized by: Shon Baton, MD   Consent:    Consent obtained:  Verbal   Consent given by:  Patient   Risks discussed:  Bleeding, infection, nerve damage and headache   Alternatives discussed:  No treatment Pre-procedure details:    Procedure purpose:  Diagnostic   Preparation: Patient was prepped and  draped in usual sterile fashion   Anesthesia (see MAR for exact dosages):    Anesthesia method:  Local infiltration   Local anesthetic:  Lidocaine 1% w/o epi Procedure details:    Lumbar space:  L3-L4 interspace   Patient position:  L lateral decubitus   Needle gauge:  22   Needle type:  Diamond point   Needle length (in):  3.5   Ultrasound guidance: no     Number of attempts:  2 Post-procedure:    Puncture site:  Adhesive bandage applied   Patient tolerance of procedure:  Tolerated well, no immediate complications Comments:     Procedure unsuccessful   (including critical care time)  Medications Ordered in ED Medications  sodium chloride 0.9 % bolus 1,000 mL (0 mLs Intravenous Stopped 09/20/17 0014)  prochlorperazine (COMPAZINE) injection 10 mg (10 mg Intravenous Given 09/19/17 2338)  diphenhydrAMINE (BENADRYL) injection 25 mg (25 mg Intravenous Given 09/19/17 2337)  meclizine (ANTIVERT) tablet 25 mg (25 mg Oral Given 09/19/17 2338)     Initial Impression / Assessment and Plan / ED Course  I have reviewed the triage vital signs and the nursing notes.  Pertinent labs & imaging results that were available during my care of the patient were reviewed by me and considered in my medical decision making (see chart for details).  Clinical Course as of Sep 21 646  Tue Sep 20, 2017  0051 Patient is feeling much better.  Headache resolved.  Reports almost no dizziness.  CT scan is negative.  Discussed with patient further work-up to fully rule out subarachnoid hemorrhage and stroke.  Patient is agreeable.  Exam remains benign.   [CH]  0254 Patient continues to feel improved.  No headache.  LP attempted x2 without success.   [CH]  [phone] Patient resting comfortably.  Denies headache.  Does report some recurrent dizziness.  Patient does meclizine.  Will obtain MRI this morning.  Patient may need image guided LP if symptoms persist to fully rule out subarachnoid hemorrhage.   [CH]      Clinical Course User Index [CH] Gerardo Territo, Mayer Masker, MD    Patient presents with headache and dizziness.  Ongoing for greater than 12 hours.  He is neurologically intact.  Vital signs are reassuring.  Considerations include peripheral vertigo, stroke, subarachnoid hemorrhage given headache.  Exam is reassuring at this time.  Patient was given Compazine, Benadryl, fluids.  CT scan is negative for acute bleed.  I discussed further work-up with patient and his wife as above.  While CT scan at this time is likely 92% sensitive for ruling out subarachnoid hemorrhage, LP is the gold standard.  LP was attempted but unsuccessful.  Unable to get image guided LP at this time.  Patient is currently asymptomatic.  Will hold patient for MRI in the morning and reassess need for LP at that time.  7:51 AM MRI reviewed.  No evidence of acute stroke.  On recheck, patient reports mild residual dizziness.  He was redosed meclizine.  Will ambulate.  Patient has had a reassuring weak work-up.  Clinically with improvement, have a lower suspicion for subarachnoid hemorrhage; however, cannot fully rule out without a lumbar puncture.  Patient at this time does not wish to pursue image guided LP.  He understands that negative CT scan is not 100%.  He and his wife stated understanding and understand the risks.  Patient able to ambulate without difficulty following meclizine.  Will discharge home.  Final Clinical Impressions(s) / ED Diagnoses   Final diagnoses:  Vertigo  Acute nonintractable headache, unspecified headache type    ED Discharge Orders    None       Keryl Gholson, Mayer Maskerourtney F, MD 09/20/17 [id number]    Shon BatonHorton, Avryl Roehm F, MD 09/20/17 [phone]

## 2017-09-20 ENCOUNTER — Emergency Department (HOSPITAL_COMMUNITY): Payer: BLUE CROSS/BLUE SHIELD

## 2017-09-20 DIAGNOSIS — R51 Headache: Secondary | ICD-10-CM | POA: Diagnosis not present

## 2017-09-20 DIAGNOSIS — R42 Dizziness and giddiness: Secondary | ICD-10-CM | POA: Diagnosis not present

## 2017-09-20 LAB — URINALYSIS, ROUTINE W REFLEX MICROSCOPIC
Bilirubin Urine: NEGATIVE
Glucose, UA: NEGATIVE mg/dL
Hgb urine dipstick: NEGATIVE
Ketones, ur: NEGATIVE mg/dL
Leukocytes, UA: NEGATIVE
Nitrite: NEGATIVE
Protein, ur: NEGATIVE mg/dL
Specific Gravity, Urine: 1.017 (ref 1.005–1.030)
pH: 6 (ref 5.0–8.0)

## 2017-09-20 MED ORDER — DIAZEPAM 5 MG PO TABS
5.0000 mg | ORAL_TABLET | Freq: Once | ORAL | Status: AC
  Administered 2017-09-20: 5 mg via ORAL
  Filled 2017-09-20: qty 1

## 2017-09-20 MED ORDER — MECLIZINE HCL 25 MG PO TABS: 25.0000 mg | ORAL_TABLET | Freq: Three times a day (TID) | ORAL | 0 refills | Status: DC | PRN

## 2017-09-20 MED ORDER — SODIUM CHLORIDE 0.9 % IV BOLUS
1000.0000 mL | Freq: Once | INTRAVENOUS | Status: AC
  Administered 2017-09-20: 1000 mL via INTRAVENOUS

## 2017-09-20 MED ORDER — DIAZEPAM 5 MG PO TABS: ORAL_TABLET | ORAL | 0 refills | Status: DC

## 2017-09-20 MED ORDER — MECLIZINE HCL 12.5 MG PO TABS
25.0000 mg | ORAL_TABLET | Freq: Once | ORAL | Status: AC
  Administered 2017-09-20: 25 mg via ORAL
  Filled 2017-09-20: qty 2

## 2017-09-20 MED ORDER — ONDANSETRON 4 MG PO TBDP: 4.0000 mg | ORAL_TABLET | Freq: Three times a day (TID) | ORAL | 0 refills | Status: DC | PRN

## 2017-09-20 NOTE — ED Provider Notes (Signed)
When patient stood up to be discharged he felt very dizzy.  Patient was given some Valium and a liter of fluids and was somewhat improved.  Still had minor to moderate dizziness at discharge time.  Patient given Valium prescription to go along with his Antivert and Zofran.  He was also put out of work until Monday and will follow up with his primary care doctor Friday if possible   Bethann BerkshireZammit, Collette Pescador, MD 09/20/17 1121

## 2017-09-20 NOTE — Discharge Instructions (Addendum)
You were seen today for dizziness.  Your symptoms are likely related to vertigo.  Your CT is negative and your MRI does not show any evidence of stroke.  If you develop acute worsening headache or any new or worsening symptoms you should be reevaluated.

## 2017-09-20 NOTE — ED Notes (Signed)
Pt had steady gate, states he still feels dizzy when standing.

## 2017-09-20 NOTE — ED Notes (Signed)
Pt returned from MRI °

## 2017-09-20 NOTE — ED Notes (Signed)
Patient has no complaints of dizziness at this time.

## 2017-09-29 DIAGNOSIS — L988 Other specified disorders of the skin and subcutaneous tissue: Secondary | ICD-10-CM | POA: Diagnosis not present

## 2017-09-29 DIAGNOSIS — D485 Neoplasm of uncertain behavior of skin: Secondary | ICD-10-CM | POA: Diagnosis not present

## 2017-10-12 DIAGNOSIS — E785 Hyperlipidemia, unspecified: Secondary | ICD-10-CM | POA: Diagnosis not present

## 2017-10-12 DIAGNOSIS — Z79899 Other long term (current) drug therapy: Secondary | ICD-10-CM | POA: Diagnosis not present

## 2017-10-12 DIAGNOSIS — Z0000 Encounter for general adult medical examination without abnormal findings: Secondary | ICD-10-CM | POA: Diagnosis not present

## 2017-10-13 ENCOUNTER — Encounter: Payer: Self-pay | Admitting: Family Medicine

## 2017-10-13 LAB — LIPID PANEL
Chol/HDL Ratio: 4.3 ratio (ref 0.0–5.0)
Cholesterol, Total: 143 mg/dL (ref 100–199)
HDL: 33 mg/dL — ABNORMAL LOW (ref >39)
LDL Calculated: 89 mg/dL (ref 0–99)
Triglycerides: 106 mg/dL (ref 0–149)
VLDL Cholesterol Cal: 21 mg/dL (ref 5–40)

## 2017-10-13 LAB — HEPATIC FUNCTION PANEL
ALT: 25 IU/L (ref 0–44)
AST: 21 IU/L (ref 0–40)
Albumin: 4.3 g/dL (ref 3.6–4.8)
Alkaline Phosphatase: 72 IU/L (ref 39–117)
Bilirubin Total: 0.4 mg/dL (ref 0.0–1.2)
Bilirubin, Direct: 0.1 mg/dL (ref 0.00–0.40)
Total Protein: 6.7 g/dL (ref 6.0–8.5)

## 2017-11-14 ENCOUNTER — Encounter: Payer: Self-pay | Admitting: Family Medicine

## 2017-11-14 ENCOUNTER — Ambulatory Visit: Payer: BLUE CROSS/BLUE SHIELD | Admitting: Family Medicine

## 2017-11-14 VITALS — BP 128/80 | Ht 68.5 in | Wt 272.8 lb

## 2017-11-14 DIAGNOSIS — R2689 Other abnormalities of gait and mobility: Secondary | ICD-10-CM

## 2017-11-14 DIAGNOSIS — D72828 Other elevated white blood cell count: Secondary | ICD-10-CM | POA: Diagnosis not present

## 2017-11-14 DIAGNOSIS — E876 Hypokalemia: Secondary | ICD-10-CM

## 2017-11-14 MED ORDER — PRAVASTATIN SODIUM 20 MG PO TABS: 20.0000 mg | ORAL_TABLET | Freq: Every day | ORAL | 1 refills | Status: DC

## 2017-11-14 NOTE — Progress Notes (Signed)
   Subjective:    Patient ID: Andrew JacobsonDavid W Manning, male    DOB: [birth year], 62 y.o.   MRN: [id number]  Hypertension  This is a chronic problem. Pertinent negatives include no chest pain, headaches or shortness of breath. Treatments tried: hyzaar. There are no compliance problems.   follow up on bloodwork results.  Patient here for follow-up regarding cholesterol.  Patient does try to maintain a reasonable diet.  Patient does take the medication on a regular basis.  Denies missing a dose.  The patient denies any obvious side effects.  Prior blood work results reviewed with the patient.  The patient is aware of his cholesterol goals and the need to keep it under good control to lessen the risk of disease.  Vertigo. Started about one month ago. Tried meclizine and valium. Still having some balance issues.  Patient denies room spinning currently not using medications currently Denies numbness weakness one side or the other Had MRI in the ER which was negative  Review of Systems  Constitutional: Negative for activity change, fatigue and fever.  HENT: Negative for congestion and rhinorrhea.   Respiratory: Negative for cough and shortness of breath.   Cardiovascular: Negative for chest pain and leg swelling.  Gastrointestinal: Negative for abdominal pain, diarrhea and nausea.  Genitourinary: Negative for dysuria and hematuria.  Neurological: Positive for dizziness. Negative for weakness and headaches.  Psychiatric/Behavioral: Negative for behavioral problems.       Objective:   Physical Exam  Constitutional: He appears well-nourished. No distress.  Cardiovascular: Normal rate, regular rhythm and normal heart sounds.  No murmur heard. Pulmonary/Chest: Effort normal and breath sounds normal. No respiratory distress.  Musculoskeletal: He exhibits no edema.  Lymphadenopathy:    He has no cervical adenopathy.  Neurological: He is alert.  Psychiatric: His behavior is normal.  Vitals  reviewed.         Assessment & Plan:  I find no evidence of a stroke no evidence of neurologic condition currently if it progresses we will need to do other intervention including possible referral to specialist  HTN- Patient was seen today as part of a visit regarding hypertension. The importance of healthy diet and regular physical activity was discussed. The importance of compliance with medications discussed.  Ideal goal is to keep blood pressure low elevated levels certainly below 140/90 when possible.  The patient was counseled that keeping blood pressure under control lessen his risk of complications.  The importance of regular follow-ups was discussed with the patient.  Low-salt diet such as DASH recommended.  Regular physical activity was recommended as well.  Patient was advised to keep regular follow-ups.  The patient was seen today as part of an evaluation regarding hyperlipidemia.  Recent lab work has been reviewed with the patient as well as the goals for good cholesterol care.  In addition to this medications have been discussed the importance of compliance with diet and medications discussed as well.  Finally the patient is aware that poor control of cholesterol, noncompliance can dramatically increase the risk of complications. The patient will keep regular office visits and the patient does agreed to periodic lab work. Patient had elevated white count in the ER along with low potassium we will recheck lab work I showed patient some balance exercises he could do to try to help I also showed him Epley maneuver handout Patient trying to tolerate lower dose of PPI but

## 2017-11-15 LAB — BASIC METABOLIC PANEL
BUN/Creatinine Ratio: 13 (ref 10–24)
BUN: 12 mg/dL (ref 8–27)
CO2: 29 mmol/L (ref 20–29)
Calcium: 9.7 mg/dL (ref 8.6–10.2)
Chloride: 101 mmol/L (ref 96–106)
Creatinine, Ser: 0.92 mg/dL (ref 0.76–1.27)
GFR calc Af Amer: 103 mL/min/{1.73_m2} (ref >59)
GFR calc non Af Amer: 89 mL/min/{1.73_m2} (ref >59)
Glucose: 92 mg/dL (ref 65–99)
Potassium: 4 mmol/L (ref 3.5–5.2)
Sodium: 140 mmol/L (ref 134–144)

## 2017-11-15 LAB — CBC WITH DIFFERENTIAL/PLATELET
Basophils Absolute: 0 10*3/uL (ref 0.0–0.2)
Basos: 0 %
EOS (ABSOLUTE): 0.1 10*3/uL (ref 0.0–0.4)
Eos: 1 %
Hematocrit: 43.1 % (ref 37.5–51.0)
Hemoglobin: 15 g/dL (ref 13.0–17.7)
Immature Grans (Abs): 0.1 10*3/uL (ref 0.0–0.1)
Immature Granulocytes: 1 %
Lymphocytes Absolute: 2.3 10*3/uL (ref 0.7–3.1)
Lymphs: 24 %
MCH: 30.9 pg (ref 26.6–33.0)
MCHC: 34.8 g/dL (ref 31.5–35.7)
MCV: 89 fL (ref 79–97)
Monocytes Absolute: 0.9 10*3/uL (ref 0.1–0.9)
Monocytes: 9 %
Neutrophils Absolute: 6.1 10*3/uL (ref 1.4–7.0)
Neutrophils: 65 %
Platelets: 296 10*3/uL (ref 150–450)
RBC: 4.86 x10E6/uL (ref 4.14–5.80)
RDW: 13.7 % (ref 12.3–15.4)
WBC: 9.5 10*3/uL (ref 3.4–10.8)

## 2017-11-16 ENCOUNTER — Telehealth: Payer: Self-pay | Admitting: *Deleted

## 2018-01-07 ENCOUNTER — Other Ambulatory Visit: Payer: Self-pay | Admitting: Family Medicine

## 2018-02-02 ENCOUNTER — Encounter: Payer: Self-pay | Admitting: Family Medicine

## 2018-02-02 ENCOUNTER — Ambulatory Visit: Payer: BLUE CROSS/BLUE SHIELD | Admitting: Family Medicine

## 2018-02-02 VITALS — BP 132/80 | Temp 98.6°F | Ht 68.5 in | Wt 269.1 lb

## 2018-02-02 DIAGNOSIS — R233 Spontaneous ecchymoses: Secondary | ICD-10-CM

## 2018-02-02 DIAGNOSIS — D72828 Other elevated white blood cell count: Secondary | ICD-10-CM

## 2018-02-02 DIAGNOSIS — M1712 Unilateral primary osteoarthritis, left knee: Secondary | ICD-10-CM

## 2018-02-02 MED ORDER — MELOXICAM 15 MG PO TABS: 15.0000 mg | ORAL_TABLET | Freq: Every day | ORAL | 2 refills | Status: DC

## 2018-02-02 NOTE — Patient Instructions (Signed)
Please do your lab  Stop aspirin

## 2018-02-02 NOTE — Progress Notes (Signed)
   Subjective:    Patient ID: Andrew Manning, male    DOB: 1955/06/05, 62 y.o.   MRN: [id number]  HPI  Patient is here today with complaints of left knee pain and swelling.Ongoing for the last few weeks.He has been taking aleve and it helps some.  Patient has moderate obesity he is trying to watch his diet trying to lose weight in addition to this relates left knee pain discomfort sometimes limps when he walks denies any injury to it.  Intermittent swelling.  Years ago he had an injection of steroids.  Denies of any specific injury recently.  Does not have knee surgery on that knee.  Review of Systems  Constitutional: Negative for activity change.  HENT: Negative for congestion and rhinorrhea.   Respiratory: Negative for cough and shortness of breath.   Cardiovascular: Negative for chest pain.  Gastrointestinal: Negative for abdominal pain, diarrhea, nausea and vomiting.  Genitourinary: Negative for dysuria and hematuria.  Musculoskeletal: Positive for arthralgias. Negative for back pain.  Neurological: Negative for weakness and headaches.  Psychiatric/Behavioral: Negative for behavioral problems and confusion.       Objective:   Physical Exam  Constitutional: He appears well-nourished. No distress.  HENT:  Head: Normocephalic and atraumatic.  Eyes: Right eye exhibits no discharge. Left eye exhibits no discharge.  Neck: No tracheal deviation present.  Cardiovascular: Normal rate, regular rhythm and normal heart sounds.  No murmur heard. Pulmonary/Chest: Effort normal and breath sounds normal. No respiratory distress.  Musculoskeletal: He exhibits no edema.  Lymphadenopathy:    He has no cervical adenopathy.  Neurological: He is alert. Coordination normal.  Skin: Skin is warm and dry.  Psychiatric: He has a normal mood and affect. His behavior is normal.  Vitals reviewed.  My exam no laxity ligaments normal minimal swelling in the knee has good range of motion patient walks  without any severe limp  Patient with hematomas under the skin that is more of a petechiae but more when he reason he has been this does not know of any injury gets on both legs but it is nowhere else would recommend a CBC     Assessment & Plan:  I believe the patient has developing osteoarthritis I recommend anti-inflammatory on a daily basis if he does not respond to that may need an injection currently x-rays MRI not indicated patient was encouraged to try to lose some weight.

## 2018-02-03 LAB — CBC WITH DIFFERENTIAL/PLATELET
Basophils Absolute: 0.1 10*3/uL (ref 0.0–0.2)
Basos: 1 %
EOS (ABSOLUTE): 0.2 10*3/uL (ref 0.0–0.4)
Eos: 2 %
Hematocrit: 42.5 % (ref 37.5–51.0)
Hemoglobin: 15 g/dL (ref 13.0–17.7)
Immature Grans (Abs): 0.1 10*3/uL (ref 0.0–0.1)
Immature Granulocytes: 1 %
Lymphocytes Absolute: 2.7 10*3/uL (ref 0.7–3.1)
Lymphs: 26 %
MCH: 30.1 pg (ref 26.6–33.0)
MCHC: 35.3 g/dL (ref 31.5–35.7)
MCV: 85 fL (ref 79–97)
Monocytes Absolute: 1.2 10*3/uL — ABNORMAL HIGH (ref 0.1–0.9)
Monocytes: 11 %
Neutrophils Absolute: 6.2 10*3/uL (ref 1.4–7.0)
Neutrophils: 59 %
Platelets: 324 10*3/uL (ref 150–450)
RBC: 4.98 x10E6/uL (ref 4.14–5.80)
RDW: 13.2 % (ref 12.3–15.4)
WBC: 10.4 10*3/uL (ref 3.4–10.8)

## 2018-02-03 NOTE — Progress Notes (Signed)
Tried calling-no answer.  

## 2018-05-04 ENCOUNTER — Ambulatory Visit: Payer: BLUE CROSS/BLUE SHIELD | Admitting: Family Medicine

## 2018-05-04 ENCOUNTER — Encounter: Payer: Self-pay | Admitting: Family Medicine

## 2018-05-04 VITALS — BP 134/80 | Ht 68.5 in | Wt 271.0 lb

## 2018-05-04 DIAGNOSIS — Z125 Encounter for screening for malignant neoplasm of prostate: Secondary | ICD-10-CM

## 2018-05-04 DIAGNOSIS — L600 Ingrowing nail: Secondary | ICD-10-CM | POA: Diagnosis not present

## 2018-05-04 DIAGNOSIS — Z1159 Encounter for screening for other viral diseases: Secondary | ICD-10-CM

## 2018-05-04 DIAGNOSIS — Z79899 Other long term (current) drug therapy: Secondary | ICD-10-CM

## 2018-05-04 DIAGNOSIS — Z23 Encounter for immunization: Secondary | ICD-10-CM | POA: Diagnosis not present

## 2018-05-04 DIAGNOSIS — K219 Gastro-esophageal reflux disease without esophagitis: Secondary | ICD-10-CM

## 2018-05-04 DIAGNOSIS — I10 Essential (primary) hypertension: Secondary | ICD-10-CM

## 2018-05-04 DIAGNOSIS — Z114 Encounter for screening for human immunodeficiency virus [HIV]: Secondary | ICD-10-CM

## 2018-05-04 DIAGNOSIS — E785 Hyperlipidemia, unspecified: Secondary | ICD-10-CM

## 2018-05-04 DIAGNOSIS — L03031 Cellulitis of right toe: Secondary | ICD-10-CM

## 2018-05-04 MED ORDER — LOSARTAN POTASSIUM-HCTZ 100-25 MG PO TABS: 1.0000 | ORAL_TABLET | Freq: Every day | ORAL | 3 refills | Status: DC

## 2018-05-04 MED ORDER — CEPHALEXIN 500 MG PO CAPS: 500.0000 mg | ORAL_CAPSULE | Freq: Four times a day (QID) | ORAL | 0 refills | Status: DC

## 2018-05-04 MED ORDER — MELOXICAM 15 MG PO TABS: 15.0000 mg | ORAL_TABLET | Freq: Every day | ORAL | 1 refills | Status: DC

## 2018-05-04 MED ORDER — PANTOPRAZOLE SODIUM 40 MG PO TBEC: 40.0000 mg | DELAYED_RELEASE_TABLET | Freq: Every day | ORAL | 3 refills | Status: DC

## 2018-05-04 MED ORDER — PRAVASTATIN SODIUM 20 MG PO TABS: 20.0000 mg | ORAL_TABLET | Freq: Every day | ORAL | 1 refills | Status: DC

## 2018-05-04 NOTE — Progress Notes (Signed)
Subjective:    Patient ID: Andrew JacobsonDavid W Manning, male    DOB: 11-Apr-1956, 62 y.o.   MRN: [id number]  Hyperlipidemia  This is a chronic problem. Pertinent negatives include no chest pain or shortness of breath. Treatments tried: pravastatin.   Patient for blood pressure check up.  The patient does have hypertension.  The patient is on medication.  Patient relates compliance with meds. Todays BP reviewed with the patient. Patient denies issues with medication. Patient relates reasonable diet. Patient tries to minimize salt. Patient aware of BP goals.  Patient here for follow-up regarding cholesterol.  The patient does have hyperlipidemia.  Patient does try to maintain a reasonable diet.  Patient does take the medication on a regular basis.  Denies missing a dose.  The patient denies any obvious side effects.  Prior blood work results reviewed with the patient.  The patient is aware of his cholesterol goals and the need to keep it under good control to lessen the risk of disease.   Taking meloxicam for left knee pain and it has been helping.  Patient has bilateral knee pain anti-inflammatory does help he does not take others with it  Would like right great toe checked. Cracked toenail back October. Pt states it started bleeding this morning. Having some pain in it.  He relates that he is having mild ingrown toenail on the right foot mainly on the lateral aspect.  Painful discomforting some bleeding some redness Would like flu vaccine.    Review of Systems  Constitutional: Negative for diaphoresis and fatigue.  HENT: Negative for congestion and rhinorrhea.   Respiratory: Negative for cough and shortness of breath.   Cardiovascular: Negative for chest pain and leg swelling.  Gastrointestinal: Negative for abdominal pain and diarrhea.  Skin: Negative for color change and rash.  Neurological: Negative for dizziness and headaches.  Psychiatric/Behavioral: Negative for behavioral problems and  confusion.       Objective:   Physical Exam  Constitutional: He appears well-nourished. No distress.  HENT:  Head: Normocephalic and atraumatic.  Eyes: Right eye exhibits no discharge. Left eye exhibits no discharge.  Neck: No tracheal deviation present.  Cardiovascular: Normal rate, regular rhythm and normal heart sounds.  No murmur heard. Pulmonary/Chest: Effort normal and breath sounds normal. No respiratory distress.  Musculoskeletal: He exhibits no edema.  Lymphadenopathy:    He has no cervical adenopathy.  Neurological: He is alert. Coordination normal.  Skin: Skin is warm and dry.  Psychiatric: He has a normal mood and affect. His behavior is normal.  Vitals reviewed.  Has ingrown toenail with cellulitis Worse on the lateral aspect I talked with the patient at length he does agree to a procedure to remove the toenail Initially we were going to do a lateral resection but the whole nail is diseased and was removed without difficulty no complications Lidocaine was used to numb his foot approximately 1 cc into each nerve block Color of the toe afterwards was normal     Assessment & Plan:  HTN- Patient was seen today as part of a visit regarding hypertension. The importance of healthy diet and regular physical activity was discussed. The importance of compliance with medications discussed.  Ideal goal is to keep blood pressure low elevated levels certainly below 140/90 when possible.  The patient was counseled that keeping blood pressure under control lessen his risk of complications.  The importance of regular follow-ups was discussed with the patient.  Low-salt diet such as DASH recommended.  Regular  physical activity was recommended as well.  Patient was advised to keep regular follow-ups.  The patient was seen today as part of an evaluation regarding hyperlipidemia.  Recent lab work has been reviewed with the patient as well as the goals for good cholesterol care.  In  addition to this medications have been discussed the importance of compliance with diet and medications discussed as well.  Finally the patient is aware that poor control of cholesterol, noncompliance can dramatically increase the risk of complications. The patient will keep regular office visits and the patient does agreed to periodic lab work.  Patient to do wellness in the springtime lab work coming up in January  Toenail removal should not have any complications use Keflex for the next 7 days follow-up if progressive troubles

## 2018-06-27 ENCOUNTER — Other Ambulatory Visit: Payer: Self-pay | Admitting: Family Medicine

## 2018-06-27 DIAGNOSIS — Z125 Encounter for screening for malignant neoplasm of prostate: Secondary | ICD-10-CM

## 2018-06-27 DIAGNOSIS — K219 Gastro-esophageal reflux disease without esophagitis: Secondary | ICD-10-CM

## 2018-06-27 DIAGNOSIS — I10 Essential (primary) hypertension: Secondary | ICD-10-CM

## 2018-07-26 ENCOUNTER — Other Ambulatory Visit: Payer: Self-pay | Admitting: Family Medicine

## 2018-09-01 ENCOUNTER — Other Ambulatory Visit: Payer: Self-pay | Admitting: Family Medicine

## 2018-09-01 DIAGNOSIS — K219 Gastro-esophageal reflux disease without esophagitis: Secondary | ICD-10-CM

## 2018-09-01 DIAGNOSIS — Z125 Encounter for screening for malignant neoplasm of prostate: Secondary | ICD-10-CM

## 2018-09-01 DIAGNOSIS — I10 Essential (primary) hypertension: Secondary | ICD-10-CM

## 2018-11-13 ENCOUNTER — Other Ambulatory Visit: Payer: Self-pay

## 2018-11-13 ENCOUNTER — Ambulatory Visit (INDEPENDENT_AMBULATORY_CARE_PROVIDER_SITE_OTHER): Payer: BC Managed Care – PPO | Admitting: Family Medicine

## 2018-11-13 DIAGNOSIS — M545 Low back pain: Secondary | ICD-10-CM | POA: Diagnosis not present

## 2018-11-13 DIAGNOSIS — S39012A Strain of muscle, fascia and tendon of lower back, initial encounter: Secondary | ICD-10-CM

## 2018-11-13 MED ORDER — PREDNISONE 20 MG PO TABS: ORAL_TABLET | ORAL | 0 refills | Status: DC

## 2018-11-13 MED ORDER — CHLORZOXAZONE 500 MG PO TABS: 500.0000 mg | ORAL_TABLET | Freq: Three times a day (TID) | ORAL | 0 refills | Status: DC | PRN

## 2018-11-13 NOTE — Progress Notes (Signed)
   Subjective:    Patient ID: Andrew Manning, male    DOB: Oct 17, 1955, 63 y.o.   MRN: [id number] Audio plus video HPI Patient calls with back pain for 2 weeks. Patient states he is currently moving and not sure if he strained something but no know injury. Patient states it hurts to move around.  Virtual Visit via Video Note  I connected with Luther Hearing on 11/13/18 at 11:30 AM EDT by a video enabled telemedicine application and verified that I am speaking with the correct person using two identifiers.  Location: Patient: home Provider: office   I discussed the limitations of evaluation and management by telemedicine and the availability of in person appointments. The patient expressed understanding and agreed to proceed.  History of Present Illness:    Observations/Objective:   Assessment and Plan:   Follow Up Instructions:    I discussed the assessment and treatment plan with the patient. The patient was provided an opportunity to ask questions and all were answered. The patient agreed with the plan and demonstrated an understanding of the instructions.   The patient was advised to call back or seek an in-person evaluation if the symptoms worsen or if the condition fails to improve as anticipated.  I provided 7minutes of non-face-to-face time during this encounter.   Patient has been doing a lot of lifting pushing and pulling.  Working hard with moving.  Pain is diffuse low back.  Some radiation to right hip.  No pain to the knee or below.  Worse with certain motions.  Stabbing at times.  Takes daily Mobic    Review of Systems No headache, no major weight loss or weight gain, no chest pain no back pain abdominal pain no change in bowel habits complete ROS otherwise negative     Objective:   Physical Exam  Virtual      Assessment & Plan:  Impression 1 lumbar strain.  Discussed with patient.  Stop Mobic.  Prednisone taper.  Add chlorzoxazone as needed for  spasm.  Expect gradual improvement no imaging rationale discussed

## 2018-11-14 ENCOUNTER — Ambulatory Visit: Payer: BC Managed Care – PPO | Admitting: Family Medicine

## 2018-11-28 ENCOUNTER — Other Ambulatory Visit: Payer: Self-pay | Admitting: Family Medicine

## 2019-01-28 ENCOUNTER — Other Ambulatory Visit: Payer: Self-pay | Admitting: Family Medicine

## 2019-01-30 NOTE — Telephone Encounter (Signed)
LVM to schedule appointment for medication follow up.

## 2019-01-30 NOTE — Telephone Encounter (Signed)
Please schedule follow-up visit either in person or virtual May have 1 refill of the 90-day

## 2019-02-28 DIAGNOSIS — L570 Actinic keratosis: Secondary | ICD-10-CM | POA: Diagnosis not present

## 2019-02-28 DIAGNOSIS — D225 Melanocytic nevi of trunk: Secondary | ICD-10-CM | POA: Diagnosis not present

## 2019-02-28 DIAGNOSIS — Z85828 Personal history of other malignant neoplasm of skin: Secondary | ICD-10-CM | POA: Diagnosis not present

## 2019-02-28 DIAGNOSIS — D239 Other benign neoplasm of skin, unspecified: Secondary | ICD-10-CM | POA: Diagnosis not present

## 2019-02-28 DIAGNOSIS — D485 Neoplasm of uncertain behavior of skin: Secondary | ICD-10-CM | POA: Diagnosis not present

## 2019-02-28 NOTE — Telephone Encounter (Signed)
[facility] - need to schedule med check  °

## 2019-03-06 ENCOUNTER — Encounter: Payer: Self-pay | Admitting: Family Medicine

## 2019-03-06 ENCOUNTER — Ambulatory Visit (INDEPENDENT_AMBULATORY_CARE_PROVIDER_SITE_OTHER): Payer: BC Managed Care – PPO | Admitting: Family Medicine

## 2019-03-06 ENCOUNTER — Other Ambulatory Visit: Payer: Self-pay

## 2019-03-06 VITALS — BP 122/74 | Temp 97.8°F | Ht 68.5 in | Wt 270.0 lb

## 2019-03-06 DIAGNOSIS — L03031 Cellulitis of right toe: Secondary | ICD-10-CM | POA: Diagnosis not present

## 2019-03-06 DIAGNOSIS — E785 Hyperlipidemia, unspecified: Secondary | ICD-10-CM | POA: Diagnosis not present

## 2019-03-06 DIAGNOSIS — Z23 Encounter for immunization: Secondary | ICD-10-CM | POA: Diagnosis not present

## 2019-03-06 DIAGNOSIS — Z114 Encounter for screening for human immunodeficiency virus [HIV]: Secondary | ICD-10-CM

## 2019-03-06 DIAGNOSIS — I10 Essential (primary) hypertension: Secondary | ICD-10-CM | POA: Diagnosis not present

## 2019-03-06 DIAGNOSIS — Z1159 Encounter for screening for other viral diseases: Secondary | ICD-10-CM

## 2019-03-06 DIAGNOSIS — Z79899 Other long term (current) drug therapy: Secondary | ICD-10-CM

## 2019-03-06 DIAGNOSIS — K219 Gastro-esophageal reflux disease without esophagitis: Secondary | ICD-10-CM | POA: Diagnosis not present

## 2019-03-06 DIAGNOSIS — Z125 Encounter for screening for malignant neoplasm of prostate: Secondary | ICD-10-CM

## 2019-03-06 MED ORDER — LOSARTAN POTASSIUM 100 MG PO TABS: 100.0000 mg | ORAL_TABLET | Freq: Every day | ORAL | 1 refills | Status: DC

## 2019-03-06 MED ORDER — PANTOPRAZOLE SODIUM 40 MG PO TBEC: 40.0000 mg | DELAYED_RELEASE_TABLET | Freq: Every day | ORAL | 1 refills | Status: DC

## 2019-03-06 MED ORDER — MELOXICAM 15 MG PO TABS: 15.0000 mg | ORAL_TABLET | Freq: Every day | ORAL | 1 refills | Status: DC

## 2019-03-06 MED ORDER — HYDROCHLOROTHIAZIDE 25 MG PO TABS: 25.0000 mg | ORAL_TABLET | Freq: Every day | ORAL | 1 refills | Status: DC

## 2019-03-06 MED ORDER — PRAVASTATIN SODIUM 20 MG PO TABS: 20.0000 mg | ORAL_TABLET | Freq: Every day | ORAL | 1 refills | Status: DC

## 2019-03-06 MED ORDER — CEPHALEXIN 500 MG PO CAPS: 500.0000 mg | ORAL_CAPSULE | Freq: Four times a day (QID) | ORAL | 0 refills | Status: DC

## 2019-03-06 NOTE — Progress Notes (Signed)
Subjective:    Patient ID: Andrew Manning, male    DOB: Feb 22, 1956, 63 y.o.   MRN: [id number]  HPIright great toe pain and swelling. Pt states this happened last year and healed up and a couple of weeks ago he was walking barefooted and stomped his toe.  Has ingrown toenail with localized infection toenail was removed once before but this is reoccurred  This patient has not been in for quite some time and is due for follow-up on multiple health issues.  We do not have the time to do a complete wellness exam but we do have time to address his chronic health issues and also order his lab work  Patient for blood pressure check up.  The patient does have hypertension.  The patient is on medication.  Patient relates compliance with meds. Todays BP reviewed with the patient. Patient denies issues with medication. Patient relates reasonable diet. Patient tries to minimize salt. Patient aware of BP goals. He does not check his blood pressure outside the office on today's visit blood pressure is good he does try to limit salt in his diet  He does have arthritis issues in his knees hips hands the meloxicam helps he denies any bleeding issues with it does need refills  Patient does have ongoing trouble with reflux.  Takes medication on a regular basis.  Tries to minimize foods as best they can.  They understand the importance of dietary compliance.  May also try to avoid eating a large meal close to bedtime.  Patient denies any dysphagia denies hematochezia.  States medicine does a good job keeping the problem under good control.  Without the medication may certainly have issues.They desire to continue taking their medication. He does state that the medication does help prevent the heartburn when he does not take it he has difficulty  Patient here for follow-up regarding cholesterol.  The patient does have hyperlipidemia.  Patient does try to maintain a reasonable diet.  Patient does take the medication  on a regular basis.  Denies missing a dose.  The patient denies any obvious side effects.  Prior blood work results reviewed with the patient.  The patient is aware of his cholesterol goals and the need to keep it under good control to lessen the risk of disease. He is due for lab work he does take his medicine previous labs reviewed  The patient's BMI is calculated.  The patient does have obesity.  The patient does try to some degree staying active and watching diet.  It is in the vital signs and acknowledged.  It is above the recommended BMI for the patient's height and weight.  The patient has been counseled regarding healthy diet, restricted portions, avoiding excessive carbohydrates/sugary foods, and increase physical activity as health permits.  It is in the patient's best interest to lower the risk of secondary illness including heart disease strokes and cancer by losing weight.  The patient acknowledges this information. Patient does need to lose some weight I encouraged him to be careful all portions keep active try to lose weight  Review of Systems  Constitutional: Negative for diaphoresis and fatigue.  HENT: Negative for congestion and rhinorrhea.   Respiratory: Negative for cough and shortness of breath.   Cardiovascular: Negative for chest pain and leg swelling.  Gastrointestinal: Negative for abdominal pain and diarrhea.  Skin: Negative for color change and rash.  Neurological: Negative for dizziness and headaches.  Psychiatric/Behavioral: Negative for behavioral problems and confusion.  Objective:   Physical Exam Vitals signs reviewed.  Constitutional:      General: He is not in acute distress. HENT:     Head: Normocephalic and atraumatic.  Eyes:     General:        Right eye: No discharge.        Left eye: No discharge.  Neck:     Trachea: No tracheal deviation.  Cardiovascular:     Rate and Rhythm: Normal rate and regular rhythm.     Heart sounds: Normal heart  sounds. No murmur.  Pulmonary:     Effort: Pulmonary effort is normal. No respiratory distress.     Breath sounds: Normal breath sounds.  Lymphadenopathy:     Cervical: No cervical adenopathy.  Skin:    General: Skin is warm and dry.  Neurological:     Mental Status: He is alert.     Coordination: Coordination normal.  Psychiatric:        Behavior: Behavior normal.           Assessment & Plan:  1. Hyperlipidemia, unspecified hyperlipidemia type Hyperlipidemia continue current medication previous labs reviewed new labs ordered - Lipid panel  2. HTN (hypertension), benign Blood pressure decent control watch salt stay physically active try to lose some weight. - Basic metabolic panel - pantoprazole (PROTONIX) 40 MG tablet; Take 1 tablet (40 mg total) by mouth daily.  Dispense: 90 tablet; Refill: 1  3. Gastroesophageal reflux disease without esophagitis Reflux decent control continue current medication. - pantoprazole (PROTONIX) 40 MG tablet; Take 1 tablet (40 mg total) by mouth daily.  Dispense: 90 tablet; Refill: 1  4. Cellulitis of great toe, right Cellulitis treat with antibiotics.  Referral to podiatry for toenail also has bunion on the left foot - Ambulatory referral to Podiatry  5. Need for vaccination Flu vaccine today. - Flu Vaccine QUAD 6+ mos PF IM (Fluarix Quad PF)  6. Screening for prostate cancer Screening for prostate cancer PSA - PSA  7. Screening for HIV (human immunodeficiency virus) Screening for HIV per CDC - HIV Antibody (routine testing w rflx)  8. Encounter for hepatitis C screening test for low risk patient Screening for hep C per CDC - Hepatitis C antibody  9. High risk medication use Check liver function to make sure tolerating medicine - Hepatic function panel  10. Prostate cancer screening Screening PSA - pantoprazole (PROTONIX) 40 MG tablet; Take 1 tablet (40 mg total) by mouth daily.  Dispense: 90 tablet; Refill: 1 Morbid obesity  watch diet stay physically active try to lose weight  Continue blood pressure medicine also reflux medicine in addition to this go with antibiotic for the toe continue cholesterol medicine as well.  In meloxicam for arthritic discomfort.  Follow-up within 6 months.

## 2019-03-06 NOTE — Telephone Encounter (Signed)
Patient already seen and meds refilled.

## 2019-03-08 DIAGNOSIS — L988 Other specified disorders of the skin and subcutaneous tissue: Secondary | ICD-10-CM | POA: Diagnosis not present

## 2019-03-08 DIAGNOSIS — D485 Neoplasm of uncertain behavior of skin: Secondary | ICD-10-CM | POA: Diagnosis not present

## 2019-03-19 ENCOUNTER — Ambulatory Visit: Payer: BC Managed Care – PPO | Admitting: Podiatry

## 2019-03-19 ENCOUNTER — Ambulatory Visit (INDEPENDENT_AMBULATORY_CARE_PROVIDER_SITE_OTHER): Payer: BC Managed Care – PPO

## 2019-03-19 ENCOUNTER — Other Ambulatory Visit: Payer: Self-pay | Admitting: Podiatry

## 2019-03-19 ENCOUNTER — Encounter: Payer: Self-pay | Admitting: Podiatry

## 2019-03-19 ENCOUNTER — Other Ambulatory Visit: Payer: Self-pay

## 2019-03-19 VITALS — BP 124/74

## 2019-03-19 DIAGNOSIS — M21612 Bunion of left foot: Secondary | ICD-10-CM | POA: Diagnosis not present

## 2019-03-19 DIAGNOSIS — M2012 Hallux valgus (acquired), left foot: Secondary | ICD-10-CM

## 2019-03-19 DIAGNOSIS — M2042 Other hammer toe(s) (acquired), left foot: Secondary | ICD-10-CM

## 2019-03-19 DIAGNOSIS — L600 Ingrowing nail: Secondary | ICD-10-CM | POA: Diagnosis not present

## 2019-03-19 DIAGNOSIS — M79672 Pain in left foot: Secondary | ICD-10-CM

## 2019-03-22 NOTE — Progress Notes (Signed)
   Subjective: 63 y.o. male presenting today as a new patient with a chief complaint of a painful bunion to the left foot that has been ongoing for the past several years. He also reports pain to the lateral border of the right great toe that has been ongoing for the past few months. Wearing shoes and applying pressure to the areas increases the pain. He has not done anything for treatment at this time. Patient is here for further evaluation and treatment.   Past Medical History:  Diagnosis Date  . Cellulitis   . GERD (gastroesophageal reflux disease)   . Hypercholesteremia   . Hypertension   . Seasonal allergies       Objective: Physical Exam General: The patient is alert and oriented x3 in no acute distress.  Dermatology: Lateral border of the right hallux appears to be erythematous with evidence of an ingrowing nail. Pain on palpation noted to the border of the nail fold. The remaining nails appear unremarkable at this time. There are no open sores, lesions. Skin is cool, dry and supple bilateral lower extremities. Negative for open lesions or macerations.  Vascular: Palpable pedal pulses bilaterally. No edema or erythema noted. Capillary refill within normal limits.  Neurological: Epicritic and protective threshold grossly intact bilaterally.   Musculoskeletal Exam: Clinical evidence of bunion deformity noted to the respective foot. There is moderate pain on palpation range of motion of the first MPJ. Lateral deviation of the hallux noted consistent with hallux abductovalgus. Hammertoe contracture also noted on clinical exam to the 2nd digit of the left foot. Symptomatic pain on palpation and range of motion also noted to the metatarsal phalangeal joints of the respective hammertoe digits.    Radiographic Exam: Increased intermetatarsal angle greater than 15 with a hallux abductus angle greater than 30 noted on AP view. Moderate degenerative changes noted within the first MPJ.  Contracture deformity also noted to the interphalangeal joints and MPJs of the digits of the respective hammertoes.    Assessment: 1. HAV w/ bunion deformity left 2. Hammertoe deformity 2nd digit left 3. Paronychia with ingrowing nail lateral border right hallux  4. Pain in toe 5. Incurvated nail   Plan of Care:  1. Patient was evaluated.  2. Mechanical debridement of the right great toenail performed using a nail nipper. Filed with dremel without incident.  3. Discussed conservative vs surgical intervention. Patient wants surgery at the beginning of next year. Only wants to take one week off work.  4. Return to clinic in 3 months for surgical consult.   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    Edrick Kins, DPM [facility]  Dr[address] Edrick Kins, [address]                                        [place], [place] [address]                Office [phone]  Fax [phone]

## 2019-04-30 ENCOUNTER — Other Ambulatory Visit: Payer: Self-pay | Admitting: Family Medicine

## 2019-05-17 DIAGNOSIS — Z79899 Other long term (current) drug therapy: Secondary | ICD-10-CM | POA: Diagnosis not present

## 2019-05-17 DIAGNOSIS — Z125 Encounter for screening for malignant neoplasm of prostate: Secondary | ICD-10-CM | POA: Diagnosis not present

## 2019-05-17 DIAGNOSIS — I10 Essential (primary) hypertension: Secondary | ICD-10-CM | POA: Diagnosis not present

## 2019-05-17 DIAGNOSIS — E785 Hyperlipidemia, unspecified: Secondary | ICD-10-CM | POA: Diagnosis not present

## 2019-05-18 ENCOUNTER — Encounter: Payer: Self-pay | Admitting: Family Medicine

## 2019-05-18 LAB — BASIC METABOLIC PANEL
BUN/Creatinine Ratio: 15 (ref 10–24)
BUN: 14 mg/dL (ref 8–27)
CO2: 24 mmol/L (ref 20–29)
Calcium: 9 mg/dL (ref 8.6–10.2)
Chloride: 100 mmol/L (ref 96–106)
Creatinine, Ser: 0.96 mg/dL (ref 0.76–1.27)
GFR calc Af Amer: 97 mL/min/{1.73_m2} (ref >59)
GFR calc non Af Amer: 84 mL/min/{1.73_m2} (ref >59)
Glucose: 104 mg/dL — ABNORMAL HIGH (ref 65–99)
Potassium: 4 mmol/L (ref 3.5–5.2)
Sodium: 141 mmol/L (ref 134–144)

## 2019-05-18 LAB — LIPID PANEL
Chol/HDL Ratio: 3.9 ratio (ref 0.0–5.0)
Cholesterol, Total: 140 mg/dL (ref 100–199)
HDL: 36 mg/dL — ABNORMAL LOW (ref >39)
LDL Chol Calc (NIH): 84 mg/dL (ref 0–99)
Triglycerides: 106 mg/dL (ref 0–149)
VLDL Cholesterol Cal: 20 mg/dL (ref 5–40)

## 2019-05-18 LAB — HEPATITIS C ANTIBODY: Hep C Virus Ab: <0.1 s/co ratio (ref 0.0–0.9)

## 2019-05-18 LAB — HIV ANTIBODY (ROUTINE TESTING W REFLEX): HIV Screen 4th Generation wRfx: NONREACTIVE

## 2019-05-18 LAB — HEPATIC FUNCTION PANEL
ALT: 26 IU/L (ref 0–44)
AST: 25 IU/L (ref 0–40)
Albumin: 4.3 g/dL (ref 3.8–4.8)
Alkaline Phosphatase: 87 IU/L (ref 39–117)
Bilirubin Total: 0.5 mg/dL (ref 0.0–1.2)
Bilirubin, Direct: 0.13 mg/dL (ref 0.00–0.40)
Total Protein: 6.7 g/dL (ref 6.0–8.5)

## 2019-05-18 LAB — PSA: Prostate Specific Ag, Serum: 0.4 ng/mL (ref 0.0–4.0)

## 2019-06-10 ENCOUNTER — Other Ambulatory Visit: Payer: Self-pay | Admitting: Family Medicine

## 2019-06-10 DIAGNOSIS — Z125 Encounter for screening for malignant neoplasm of prostate: Secondary | ICD-10-CM

## 2019-06-10 DIAGNOSIS — K219 Gastro-esophageal reflux disease without esophagitis: Secondary | ICD-10-CM

## 2019-06-10 DIAGNOSIS — I10 Essential (primary) hypertension: Secondary | ICD-10-CM

## 2019-06-11 ENCOUNTER — Other Ambulatory Visit: Payer: Self-pay | Admitting: Family Medicine

## 2019-06-11 DIAGNOSIS — K219 Gastro-esophageal reflux disease without esophagitis: Secondary | ICD-10-CM

## 2019-06-11 DIAGNOSIS — Z125 Encounter for screening for malignant neoplasm of prostate: Secondary | ICD-10-CM

## 2019-06-11 DIAGNOSIS — I10 Essential (primary) hypertension: Secondary | ICD-10-CM

## 2019-06-18 ENCOUNTER — Other Ambulatory Visit: Payer: Self-pay

## 2019-06-18 ENCOUNTER — Ambulatory Visit: Payer: BC Managed Care – PPO | Admitting: Podiatry

## 2019-06-18 DIAGNOSIS — M21612 Bunion of left foot: Secondary | ICD-10-CM | POA: Diagnosis not present

## 2019-06-18 DIAGNOSIS — M2042 Other hammer toe(s) (acquired), left foot: Secondary | ICD-10-CM | POA: Diagnosis not present

## 2019-06-18 NOTE — Patient Instructions (Signed)
Pre-Operative Instructions  Congratulations, you have decided to take an important step towards improving your quality of life.  You can be assured that the doctors and staff at [facility] will be with you every step of the way.  Here are some important things you should know:  1. Plan to be at the surgery center/hospital at least 1 (one) hour prior to your scheduled time, unless otherwise directed by the surgical center/hospital staff.  You must have a responsible adult accompany you, remain during the surgery and drive you home.  Make sure you have directions to the surgical center/hospital to ensure you arrive on time. 2. If you are having surgery at [facility] or [facility], you will need a copy of your medical history and physical form from your family physician within one month prior to the date of surgery. We will give you a form for your primary physician to complete.  3. We make every effort to accommodate the date you request for surgery.  However, there are times where surgery dates or times have to be moved.  We will contact you as soon as possible if a change in schedule is required.   4. No aspirin/ibuprofen for one week before surgery.  If you are on aspirin, any non-steroidal anti-inflammatory medications (Mobic, Aleve, Ibuprofen) should not be taken seven (7) days prior to your surgery.  You make take Tylenol for pain prior to surgery.  5. Medications - If you are taking daily heart and blood pressure medications, seizure, reflux, allergy, asthma, anxiety, pain or diabetes medications, make sure you notify the surgery center/hospital before the day of surgery so they can tell you which medications you should take or avoid the day of surgery. 6. No food or drink after midnight the night before surgery unless directed otherwise by surgical center/hospital staff. 7. No alcoholic beverages 24-hours prior to surgery.  No smoking 24-hours prior or 24-hours after  surgery. 8. Wear loose pants or shorts. They should be loose enough to fit over bandages, boots, and casts. 9. Don't wear slip-on shoes. Sneakers are preferred. 10. Bring your boot with you to the surgery center/hospital.  Also bring crutches or a walker if your physician has prescribed it for you.  If you do not have this equipment, it will be provided for you after surgery. 11. If you have not been contacted by the surgery center/hospital by the day before your surgery, call to confirm the date and time of your surgery. 12. Leave-time from work may vary depending on the type of surgery you have.  Appropriate arrangements should be made prior to surgery with your employer. 13. Prescriptions will be provided immediately following surgery by your doctor.  Fill these as soon as possible after surgery and take the medication as directed. Pain medications will not be refilled on weekends and must be approved by the doctor. 14. Remove nail polish on the operative foot and avoid getting pedicures prior to surgery. 15. Wash the night before surgery.  The night before surgery wash the foot and leg well with water and the antibacterial soap provided. Be sure to pay special attention to beneath the toenails and in between the toes.  Wash for at least three (3) minutes. Rinse thoroughly with water and dry well with a towel.  Perform this wash unless told not to do so by your physician.  Enclosed: 1 Ice pack (please put in freezer the night before surgery)   1 Hibiclens skin cleaner     Pre-op instructions  If you have any questions regarding the instructions, please do not hesitate to call our office.  [place]: [address], [place], [place] [address] -- [phone]  [place]: [address] [place], [place] [address] -- [phone]  [place]: [address], [place], [place] [address] -- [phone]   Website: [email or web address] 

## 2019-06-21 NOTE — Progress Notes (Signed)
   Subjective: 64 y.o. male presenting today for follow up evaluation of a painful bunion deformity of the left foot as well as a painful hammertoe of the 2nd digit of the left foot. Wearing shoes and bearing weight increases the pain to both areas. He has been trying to rest the foot as much as possible for treatment. Patient is here for further evaluation and treatment.   Past Medical History:  Diagnosis Date  . Cellulitis   . GERD (gastroesophageal reflux disease)   . Hypercholesteremia   . Hypertension   . Seasonal allergies      Objective: Physical Exam General: The patient is alert and oriented x3 in no acute distress.  Dermatology: Skin is cool, dry and supple bilateral lower extremities. Negative for open lesions or macerations.  Vascular: Palpable pedal pulses bilaterally. No edema or erythema noted. Capillary refill within normal limits.  Neurological: Epicritic and protective threshold grossly intact bilaterally.   Musculoskeletal Exam: Clinical evidence of bunion deformity noted to the respective foot. There is moderate pain on palpation range of motion of the first MPJ. Lateral deviation of the hallux noted consistent with hallux abductovalgus. Hammertoe contracture also noted on clinical exam to the 2nd digit of the left foot. Symptomatic pain on palpation and range of motion also noted to the metatarsal phalangeal joints of the respective hammertoe digits.     Assessment: 1. HAV w/ bunion deformity left 2. Hammertoe deformity 2nd toe left     Plan of Care:  1. Patient was evaluated.  2. Today we discussed the conservative versus surgical management of the presenting pathology. The patient opts for surgical management. All possible complications and details of the procedure were explained. All patient questions were answered. No guarantees were expressed or implied. 3. Authorization for surgery was initiated today. Surgery will consist of bunionectomy with osteotomy  left; PIPJ arthroplasty with MPJ capsulotomy 2nd left; Weil osteotomy 2nd metatarsal left.  4. CAM boot dispensed.  5. Return to clinic one week post op.   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    Felecia Shelling, DPM [facility]  Dr. Felecia Shelling, DPM    [address]                                        [place], [place] [address]                Office [phone]  Fax [phone]

## 2019-06-22 ENCOUNTER — Telehealth: Payer: Self-pay | Admitting: *Deleted

## 2019-06-22 NOTE — Telephone Encounter (Signed)
"  I'm calling in reference to foot surgery I'm having done on February 4.  When I came in there on Monday, they said you were out and that you would call me back about getting a time scheduled for my surgery.  I'm trying to follow up on it.  If you would, give me a call back."  "I was calling in regards to talking to you about the schedule for February 4 for toe surgery.  Could you give me a call back?"  "I was calling to find out if you got my surgery scheduled for February 4.  I'm trying to find out the time because I got to line up somebody to take days off to take me to the hospital so I can have the surgery done.  I need to get them so they can get their time scheduled.  Give me a call back."  I attempted to return his call.  I left him a message informing him that he will receive a call from someone from the surgical center a day or two prior to his surgery date and that person will give him his arrival time.  I gave him the phone number to the surgery center which is [phone] and informed him the number is also on the back of the brochure that we gave him.  I asked him to call if he has further questions.  I also apologized to him about the delay in returning his call because I have been in training sessions last week.

## 2019-07-02 ENCOUNTER — Other Ambulatory Visit: Payer: Self-pay | Admitting: Family Medicine

## 2019-07-04 ENCOUNTER — Telehealth: Payer: Self-pay | Admitting: *Deleted

## 2019-07-04 NOTE — Telephone Encounter (Signed)
DOS 07/05/2019  DOUBLE OSTEOTOMY - 28299, METATARSAL OSTEOTOMY 2ND - 37858, HAMMER TOE REPAIR 2ND - 85027, AND CAPSULOTOMY MPJ RELEASE HJOINT 2ND - 74128 OF THE LEFT FOOT  BCBS: Eligibility Date - 04/01/2019  -  05/30/2198  Member Liability Summary       In-Network   Max Per Benefit Period        Year-to-Date Remaining     CoInsurance   20%       Deductible               $1500.00 $1500.00     Out-Of-Pocket 3   $3500.00 $3405.98  In Network Copay                             Coinsurance          Authorization Required Not Applicable          20%  per  Service Year                   No

## 2019-07-05 ENCOUNTER — Other Ambulatory Visit: Payer: Self-pay | Admitting: Podiatry

## 2019-07-05 ENCOUNTER — Encounter: Payer: Self-pay | Admitting: Podiatry

## 2019-07-05 DIAGNOSIS — M2042 Other hammer toe(s) (acquired), left foot: Secondary | ICD-10-CM | POA: Diagnosis not present

## 2019-07-05 DIAGNOSIS — M21542 Acquired clubfoot, left foot: Secondary | ICD-10-CM | POA: Diagnosis not present

## 2019-07-05 DIAGNOSIS — M2012 Hallux valgus (acquired), left foot: Secondary | ICD-10-CM | POA: Diagnosis not present

## 2019-07-05 DIAGNOSIS — M25572 Pain in left ankle and joints of left foot: Secondary | ICD-10-CM | POA: Diagnosis not present

## 2019-07-05 DIAGNOSIS — M7752 Other enthesopathy of left foot: Secondary | ICD-10-CM | POA: Diagnosis not present

## 2019-07-05 DIAGNOSIS — I10 Essential (primary) hypertension: Secondary | ICD-10-CM | POA: Diagnosis not present

## 2019-07-05 MED ORDER — OXYCODONE-ACETAMINOPHEN 5-325 MG PO TABS: 1.0000 | ORAL_TABLET | Freq: Four times a day (QID) | ORAL | 0 refills | Status: DC | PRN

## 2019-07-05 NOTE — Progress Notes (Signed)
PRN postop 

## 2019-07-11 ENCOUNTER — Ambulatory Visit: Payer: BC Managed Care – PPO | Admitting: Podiatry

## 2019-07-11 ENCOUNTER — Ambulatory Visit (INDEPENDENT_AMBULATORY_CARE_PROVIDER_SITE_OTHER): Payer: BC Managed Care – PPO

## 2019-07-11 ENCOUNTER — Other Ambulatory Visit: Payer: Self-pay

## 2019-07-11 DIAGNOSIS — M21612 Bunion of left foot: Secondary | ICD-10-CM

## 2019-07-11 DIAGNOSIS — M2042 Other hammer toe(s) (acquired), left foot: Secondary | ICD-10-CM | POA: Diagnosis not present

## 2019-07-11 MED ORDER — DOXYCYCLINE HYCLATE 100 MG PO TABS: 100.0000 mg | ORAL_TABLET | Freq: Two times a day (BID) | ORAL | 0 refills | Status: DC

## 2019-07-16 NOTE — Progress Notes (Signed)
   Subjective:  Patient presents today status post bunionectomy and 2nd hammertoe repair left. DOS: 07/05/2019. He states he is doing well. He reports a decrease in sensation of the dorsal forefoot and denies any significant pain. He reports some associated redness and swelling. He has been using the CAM boot as directed. He denies any worsening factors at this time. Patient is here for further evaluation and treatment.    Past Medical History:  Diagnosis Date  . Cellulitis   . GERD (gastroesophageal reflux disease)   . Hypercholesteremia   . Hypertension   . Seasonal allergies       Objective/Physical Exam Neurovascular status intact.  Skin incisions appear to be well coapted with sutures and staples intact. No sign of infectious process noted. No dehiscence. No active bleeding noted. Moderate edema noted to the surgical extremity.  Radiographic Exam:  Orthopedic hardware and osteotomies sites appear to be stable with routine healing.  Assessment: 1. s/p bunionectomy and 2nd hammertoe repair left. DOS: 07/05/2019   Plan of Care:  1. Patient was evaluated. X-rays reviewed 2. Dressing changed. Keep clean, dry and intact for one week.  3. Continue weightbearing in CAM boot.  4. Prescription for Doxycycline 100 mg #20 provided to patient.  5. Return to clinic in one week.    Felecia Shelling, DPM [facility]  Dr. Felecia Shelling, DPM    [address]                                        [place], [place] [address]                Office [phone]  Fax [phone]

## 2019-07-18 ENCOUNTER — Ambulatory Visit (INDEPENDENT_AMBULATORY_CARE_PROVIDER_SITE_OTHER): Payer: BC Managed Care – PPO

## 2019-07-18 ENCOUNTER — Ambulatory Visit (INDEPENDENT_AMBULATORY_CARE_PROVIDER_SITE_OTHER): Payer: BC Managed Care – PPO | Admitting: Podiatry

## 2019-07-18 ENCOUNTER — Other Ambulatory Visit: Payer: Self-pay

## 2019-07-18 VITALS — Temp 97.1°F

## 2019-07-18 DIAGNOSIS — M21612 Bunion of left foot: Secondary | ICD-10-CM

## 2019-07-18 DIAGNOSIS — Z9889 Other specified postprocedural states: Secondary | ICD-10-CM

## 2019-07-18 DIAGNOSIS — M2042 Other hammer toe(s) (acquired), left foot: Secondary | ICD-10-CM

## 2019-07-22 NOTE — Progress Notes (Signed)
   Subjective:  Patient presents today status post bunionectomy and 2nd hammertoe repair left. DOS: 07/05/2019. He states he is doing well. He has been taking Ibuprofen as needed for treatment which alleviates any pain he may have. He has been taking Doxycycline as directed. He continues to use the CAM boot. There are no worsening factors noted. Patient is here for further evaluation and treatment.    Past Medical History:  Diagnosis Date  . Cellulitis   . GERD (gastroesophageal reflux disease)   . Hypercholesteremia   . Hypertension   . Seasonal allergies       Objective/Physical Exam Neurovascular status intact.  Skin incisions appear to be well coapted with sutures and staples intact. No sign of infectious process noted. No dehiscence. No active bleeding noted. Moderate edema noted to the surgical extremity.  Radiographic Exam:  Orthopedic hardware and osteotomies sites appear to be stable with routine healing.  Assessment: 1. s/p bunionectomy and 2nd hammertoe repair left. DOS: 07/05/2019   Plan of Care:  1. Patient was evaluated. X-rays reviewed 2. Continue taking Doxycycline as prescribed.  3. Staples removed. Dry sterile dressing applied.  4. Continue using CAM boot.  5. Return to clinic in 2 weeks for follow up X-Ray and pin removal.     Felecia Shelling, DPM [facility]  Dr. Felecia Shelling, DPM    [address]                                        [place], [place] [address]                Office [phone]  Fax [phone]

## 2019-08-01 ENCOUNTER — Other Ambulatory Visit: Payer: Self-pay

## 2019-08-01 ENCOUNTER — Ambulatory Visit (INDEPENDENT_AMBULATORY_CARE_PROVIDER_SITE_OTHER): Payer: BC Managed Care – PPO | Admitting: Podiatry

## 2019-08-01 ENCOUNTER — Encounter: Payer: Self-pay | Admitting: Podiatry

## 2019-08-01 ENCOUNTER — Ambulatory Visit (INDEPENDENT_AMBULATORY_CARE_PROVIDER_SITE_OTHER): Payer: BC Managed Care – PPO

## 2019-08-01 DIAGNOSIS — M2042 Other hammer toe(s) (acquired), left foot: Secondary | ICD-10-CM

## 2019-08-01 DIAGNOSIS — M2012 Hallux valgus (acquired), left foot: Secondary | ICD-10-CM

## 2019-08-01 DIAGNOSIS — Z9889 Other specified postprocedural states: Secondary | ICD-10-CM

## 2019-08-02 ENCOUNTER — Other Ambulatory Visit: Payer: Self-pay | Admitting: Family Medicine

## 2019-08-04 NOTE — Progress Notes (Signed)
   Subjective:  Patient presents today status post bunionectomy and 2nd hammertoe repair left. DOS: 07/05/2019. He states he is doing well. He reports some minor intermittent swelling. He has been using the CAM boot as directed. He denies any pain or worsening factors. Patient is here for further evaluation and treatment.     Past Medical History:  Diagnosis Date  . Cellulitis   . GERD (gastroesophageal reflux disease)   . Hypercholesteremia   . Hypertension   . Seasonal allergies       Objective/Physical Exam Neurovascular status intact.  Skin incisions appear to be well coapted. No sign of infectious process noted. No dehiscence. No active bleeding noted. Moderate edema noted to the surgical extremity.  Radiographic Exam:  Orthopedic hardware and osteotomies sites appear to be stable with routine healing.  Assessment: 1. s/p bunionectomy and 2nd hammertoe repair left. DOS: 07/05/2019   Plan of Care:  1. Patient was evaluated. X-rays reviewed 2. Percutaneous pin removed.  3. Post op shoe dispensed. Discontinue using CAM boot.  4. Returning to work on 08/06/2019.  5. Return to clinic in 4 weeks to transition into sneakers.   Maintenance Production designer, theatre/television/film. Sitting mostly.      Felecia Shelling, DPM [facility]  Dr. Felecia Shelling, DPM    [address]                                        [place], [place] [address]                Office [phone]  Fax [phone]

## 2019-08-29 ENCOUNTER — Encounter: Payer: Self-pay | Admitting: Podiatry

## 2019-08-29 ENCOUNTER — Ambulatory Visit (INDEPENDENT_AMBULATORY_CARE_PROVIDER_SITE_OTHER): Payer: BC Managed Care – PPO

## 2019-08-29 ENCOUNTER — Other Ambulatory Visit: Payer: Self-pay

## 2019-08-29 ENCOUNTER — Ambulatory Visit (INDEPENDENT_AMBULATORY_CARE_PROVIDER_SITE_OTHER): Payer: BC Managed Care – PPO | Admitting: Podiatry

## 2019-08-29 DIAGNOSIS — M2042 Other hammer toe(s) (acquired), left foot: Secondary | ICD-10-CM

## 2019-08-29 DIAGNOSIS — Z9889 Other specified postprocedural states: Secondary | ICD-10-CM

## 2019-08-29 DIAGNOSIS — M2012 Hallux valgus (acquired), left foot: Secondary | ICD-10-CM

## 2019-09-02 ENCOUNTER — Other Ambulatory Visit: Payer: Self-pay | Admitting: Family Medicine

## 2019-09-02 DIAGNOSIS — Z125 Encounter for screening for malignant neoplasm of prostate: Secondary | ICD-10-CM

## 2019-09-02 DIAGNOSIS — I10 Essential (primary) hypertension: Secondary | ICD-10-CM

## 2019-09-02 DIAGNOSIS — K219 Gastro-esophageal reflux disease without esophagitis: Secondary | ICD-10-CM

## 2019-09-05 NOTE — Progress Notes (Signed)
   Subjective:  Patient presents today status post bunionectomy and 2nd hammertoe repair left. DOS: 07/05/2019. He reports swelling of the foot. He reports some tingling of the 2nd toe of the left foot. He has been icing the foot which helps alleviate the symptoms. There are no worsening factors noted. Patient is here for further evaluation and treatment.    Past Medical History:  Diagnosis Date  . Cellulitis   . GERD (gastroesophageal reflux disease)   . Hypercholesteremia   . Hypertension   . Seasonal allergies       Objective/Physical Exam Neurovascular status intact.  Skin incisions appear to be well coapted. No sign of infectious process noted. No dehiscence. No active bleeding noted. Moderate edema noted to the surgical extremity.  Radiographic Exam:  Orthopedic hardware and osteotomies sites appear to be stable with routine healing.  Assessment: 1. s/p bunionectomy and 2nd hammertoe repair left. DOS: 07/05/2019   Plan of Care:  1. Patient was evaluated. X-rays reviewed 2. May resume full activity with no restrictions.  3. Discontinue using CAM boot. Recommended good shoe gear.  4. Return to clinic as needed.   Maintenance Production designer, theatre/television/film. Sitting mostly.      Felecia Shelling, DPM [facility]  Dr. Felecia Shelling, DPM    [address]                                        [place], [place] [address]                Office [phone]  Fax [phone]

## 2019-09-29 ENCOUNTER — Other Ambulatory Visit: Payer: Self-pay | Admitting: Family Medicine

## 2019-12-03 ENCOUNTER — Other Ambulatory Visit: Payer: Self-pay | Admitting: Family Medicine

## 2019-12-03 DIAGNOSIS — Z125 Encounter for screening for malignant neoplasm of prostate: Secondary | ICD-10-CM

## 2019-12-03 DIAGNOSIS — K219 Gastro-esophageal reflux disease without esophagitis: Secondary | ICD-10-CM

## 2019-12-03 DIAGNOSIS — I10 Essential (primary) hypertension: Secondary | ICD-10-CM

## 2019-12-04 NOTE — Telephone Encounter (Signed)
Scheduled 7/30

## 2019-12-28 ENCOUNTER — Ambulatory Visit (INDEPENDENT_AMBULATORY_CARE_PROVIDER_SITE_OTHER): Payer: BC Managed Care – PPO | Admitting: Family Medicine

## 2019-12-28 ENCOUNTER — Encounter: Payer: Self-pay | Admitting: Family Medicine

## 2019-12-28 ENCOUNTER — Other Ambulatory Visit: Payer: Self-pay

## 2019-12-28 VITALS — BP 118/68 | HR 88 | Temp 97.6°F | Ht 68.5 in | Wt 271.0 lb

## 2019-12-28 DIAGNOSIS — Z79899 Other long term (current) drug therapy: Secondary | ICD-10-CM | POA: Diagnosis not present

## 2019-12-28 DIAGNOSIS — K219 Gastro-esophageal reflux disease without esophagitis: Secondary | ICD-10-CM

## 2019-12-28 DIAGNOSIS — I10 Essential (primary) hypertension: Secondary | ICD-10-CM | POA: Diagnosis not present

## 2019-12-28 DIAGNOSIS — Z125 Encounter for screening for malignant neoplasm of prostate: Secondary | ICD-10-CM

## 2019-12-28 DIAGNOSIS — E785 Hyperlipidemia, unspecified: Secondary | ICD-10-CM | POA: Diagnosis not present

## 2019-12-28 MED ORDER — PANTOPRAZOLE SODIUM 40 MG PO TBEC: 40.0000 mg | DELAYED_RELEASE_TABLET | Freq: Every day | ORAL | 1 refills | Status: DC

## 2019-12-28 MED ORDER — LOSARTAN POTASSIUM 100 MG PO TABS: ORAL_TABLET | ORAL | 1 refills | Status: DC

## 2019-12-28 MED ORDER — MELOXICAM 15 MG PO TABS: 15.0000 mg | ORAL_TABLET | Freq: Every day | ORAL | 1 refills | Status: DC

## 2019-12-28 MED ORDER — HYDROCHLOROTHIAZIDE 25 MG PO TABS: ORAL_TABLET | ORAL | 1 refills | Status: DC

## 2019-12-28 MED ORDER — PRAVASTATIN SODIUM 20 MG PO TABS: 20.0000 mg | ORAL_TABLET | Freq: Every day | ORAL | 1 refills | Status: DC

## 2019-12-28 NOTE — Progress Notes (Signed)
   Subjective:    Patient ID: Andrew Manning, male    DOB: [birth year], 63 y.o.   MRN: [id number]  Hyperlipidemia This is a chronic problem. Pertinent negatives include no chest pain or shortness of breath. Treatments tried: pravastatin. There are no compliance problems (tries to eat healthy, walks for exercise, takes med every day).    HTN (hypertension), benign - Plan: Basic metabolic panel, pantoprazole (PROTONIX) 40 MG tablet  Hyperlipidemia, unspecified hyperlipidemia type - Plan: Lipid panel  Screening for prostate cancer - Plan: PSA  High risk medication use - Plan: Hepatic function panel  Gastroesophageal reflux disease without esophagitis - Plan: pantoprazole (PROTONIX) 40 MG tablet  Prostate cancer screening - Plan: pantoprazole (PROTONIX) 40 MG tablet       Review of Systems  Constitutional: Negative for diaphoresis and fatigue.  HENT: Negative for congestion and rhinorrhea.   Respiratory: Negative for cough and shortness of breath.   Cardiovascular: Negative for chest pain and leg swelling.  Gastrointestinal: Negative for abdominal pain and diarrhea.  Skin: Negative for color change and rash.  Neurological: Negative for dizziness and headaches.  Psychiatric/Behavioral: Negative for behavioral problems and confusion.       Objective:   Physical Exam Vitals reviewed.  Constitutional:      General: He is not in acute distress. HENT:     Head: Normocephalic and atraumatic.  Eyes:     General:        Right eye: No discharge.        Left eye: No discharge.  Neck:     Trachea: No tracheal deviation.  Cardiovascular:     Rate and Rhythm: Normal rate and regular rhythm.     Heart sounds: Normal heart sounds. No murmur heard.   Pulmonary:     Effort: Pulmonary effort is normal. No respiratory distress.     Breath sounds: Normal breath sounds.  Lymphadenopathy:     Cervical: No cervical adenopathy.  Skin:    General: Skin is warm and dry.  Neurological:       Mental Status: He is alert.     Coordination: Coordination normal.  Psychiatric:        Behavior: Behavior normal.     Healthy diet portion control walking recommended to help lose weight      Assessment & Plan:  1. HTN (hypertension), benign Blood pressure good control continue current measures - Basic metabolic panel - pantoprazole (PROTONIX) 40 MG tablet; Take 1 tablet (40 mg total) by mouth daily.  Dispense: 90 tablet; Refill: 1  2. Hyperlipidemia, unspecified hyperlipidemia type Continue pravastatin watch diet stay active - Lipid panel  3. Screening for prostate cancer PSA blood work to be done in December with wellness exam - PSA  4. High risk medication use Check lab work for liver function based on medicine - Hepatic function panel  5. Gastroesophageal reflux disease without esophagitis Does well with his acid blocker he would like to continue it - pantoprazole (PROTONIX) 40 MG tablet; Take 1 tablet (40 mg total) by mouth daily.  Dispense: 90 tablet; Refill: 1  6. Prostate cancer screening PSA to be done later this year with wellness exam - pantoprazole (PROTONIX) 40 MG tablet; Take 1 tablet (40 mg total) by mouth daily.  Dispense: 90 tablet; Refill: 1   Wellness exam later this year recommended

## 2020-01-30 ENCOUNTER — Other Ambulatory Visit: Payer: Self-pay | Admitting: Family Medicine

## 2020-02-27 DIAGNOSIS — D225 Melanocytic nevi of trunk: Secondary | ICD-10-CM | POA: Diagnosis not present

## 2020-02-27 DIAGNOSIS — L570 Actinic keratosis: Secondary | ICD-10-CM | POA: Diagnosis not present

## 2020-02-27 DIAGNOSIS — D485 Neoplasm of uncertain behavior of skin: Secondary | ICD-10-CM | POA: Diagnosis not present

## 2020-05-26 DIAGNOSIS — Z79899 Other long term (current) drug therapy: Secondary | ICD-10-CM | POA: Diagnosis not present

## 2020-05-26 DIAGNOSIS — I10 Essential (primary) hypertension: Secondary | ICD-10-CM | POA: Diagnosis not present

## 2020-05-26 DIAGNOSIS — E785 Hyperlipidemia, unspecified: Secondary | ICD-10-CM | POA: Diagnosis not present

## 2020-05-26 DIAGNOSIS — Z125 Encounter for screening for malignant neoplasm of prostate: Secondary | ICD-10-CM | POA: Diagnosis not present

## 2020-05-27 LAB — BASIC METABOLIC PANEL
BUN/Creatinine Ratio: 12 (ref 10–24)
BUN: 13 mg/dL (ref 8–27)
CO2: 26 mmol/L (ref 20–29)
Calcium: 9.2 mg/dL (ref 8.6–10.2)
Chloride: 98 mmol/L (ref 96–106)
Creatinine, Ser: 1.09 mg/dL (ref 0.76–1.27)
GFR calc Af Amer: 82 mL/min/{1.73_m2} (ref >59)
GFR calc non Af Amer: 71 mL/min/{1.73_m2} (ref >59)
Glucose: 101 mg/dL — ABNORMAL HIGH (ref 65–99)
Potassium: 3.3 mmol/L — ABNORMAL LOW (ref 3.5–5.2)
Sodium: 141 mmol/L (ref 134–144)

## 2020-05-27 LAB — LIPID PANEL
Chol/HDL Ratio: 4 ratio (ref 0.0–5.0)
Cholesterol, Total: 141 mg/dL (ref 100–199)
HDL: 35 mg/dL — ABNORMAL LOW (ref >39)
LDL Chol Calc (NIH): 81 mg/dL (ref 0–99)
Triglycerides: 138 mg/dL (ref 0–149)
VLDL Cholesterol Cal: 25 mg/dL (ref 5–40)

## 2020-05-27 LAB — HEPATIC FUNCTION PANEL
ALT: 25 IU/L (ref 0–44)
AST: 20 IU/L (ref 0–40)
Albumin: 4.3 g/dL (ref 3.8–4.8)
Alkaline Phosphatase: 86 IU/L (ref 44–121)
Bilirubin Total: 0.3 mg/dL (ref 0.0–1.2)
Bilirubin, Direct: 0.11 mg/dL (ref 0.00–0.40)
Total Protein: 6.6 g/dL (ref 6.0–8.5)

## 2020-05-27 LAB — PSA: Prostate Specific Ag, Serum: 0.7 ng/mL (ref 0.0–4.0)

## 2020-05-29 ENCOUNTER — Other Ambulatory Visit: Payer: Self-pay

## 2020-05-29 ENCOUNTER — Ambulatory Visit (INDEPENDENT_AMBULATORY_CARE_PROVIDER_SITE_OTHER): Payer: BC Managed Care – PPO | Admitting: Family Medicine

## 2020-05-29 VITALS — BP 114/68 | Temp 98.2°F | Ht 68.5 in | Wt 275.4 lb

## 2020-05-29 DIAGNOSIS — Z0000 Encounter for general adult medical examination without abnormal findings: Secondary | ICD-10-CM

## 2020-05-29 DIAGNOSIS — I10 Essential (primary) hypertension: Secondary | ICD-10-CM

## 2020-05-29 DIAGNOSIS — Z125 Encounter for screening for malignant neoplasm of prostate: Secondary | ICD-10-CM

## 2020-05-29 DIAGNOSIS — K219 Gastro-esophageal reflux disease without esophagitis: Secondary | ICD-10-CM | POA: Diagnosis not present

## 2020-05-29 MED ORDER — PANTOPRAZOLE SODIUM 40 MG PO TBEC: 40.0000 mg | DELAYED_RELEASE_TABLET | Freq: Every day | ORAL | 1 refills | Status: DC

## 2020-05-29 MED ORDER — PRAVASTATIN SODIUM 20 MG PO TABS: 20.0000 mg | ORAL_TABLET | Freq: Every day | ORAL | 1 refills | Status: DC

## 2020-05-29 MED ORDER — HYDROCHLOROTHIAZIDE 12.5 MG PO CAPS: 12.5000 mg | ORAL_CAPSULE | Freq: Every day | ORAL | 1 refills | Status: DC

## 2020-05-29 MED ORDER — LOSARTAN POTASSIUM 100 MG PO TABS: ORAL_TABLET | ORAL | 1 refills | Status: DC

## 2020-05-29 MED ORDER — MELOXICAM 15 MG PO TABS: 15.0000 mg | ORAL_TABLET | Freq: Every day | ORAL | 1 refills | Status: DC

## 2020-05-29 NOTE — Patient Instructions (Addendum)
Results for orders placed or performed in visit on 12/28/19  Lipid panel  Result Value Ref Range   Cholesterol, Total 141 100 - 199 mg/dL   Triglycerides 237 0 - 149 mg/dL   HDL 35 (L) >62 mg/dL   VLDL Cholesterol Cal 25 5 - 40 mg/dL   LDL Chol Calc (NIH) 81 0 - 99 mg/dL   Chol/HDL Ratio 4.0 0.0 - 5.0 ratio  Hepatic function panel  Result Value Ref Range   Total Protein 6.6 6.0 - 8.5 g/dL   Albumin 4.3 3.8 - 4.8 g/dL   Bilirubin Total 0.3 0.0 - 1.2 mg/dL   Bilirubin, Direct 8.31 0.00 - 0.40 mg/dL   Alkaline Phosphatase 86 44 - 121 IU/L   AST 20 0 - 40 IU/L   ALT 25 0 - 44 IU/L  PSA  Result Value Ref Range   Prostate Specific Ag, Serum 0.7 0.0 - 4.0 ng/mL  Basic metabolic panel  Result Value Ref Range   Glucose 101 (H) 65 - 99 mg/dL   BUN 13 8 - 27 mg/dL   Creatinine, Ser 5.17 0.76 - 1.27 mg/dL   GFR calc non Af Amer 71 >59 mL/min/1.73   GFR calc Af Amer 82 >59 mL/min/1.73   BUN/Creatinine Ratio 12 10 - 24   Sodium 141 134 - 144 mmol/L   Potassium 3.3 (L) 3.5 - 5.2 mmol/L   Chloride 98 96 - 106 mmol/L   CO2 26 20 - 29 mmol/L   Calcium 9.2 8.6 - 10.2 mg/dL   Shingrix and shingles prevention: know the facts!   Shingrix is a very effective vaccine to prevent shingles.   Shingles is a reactivation of chickenpox -more than 99% of Americans born before 1980 have had chickenpox even if they do not remember it. One in every 10 people who get shingles have severe long-lasting nerve pain as a result.   33 out of a 100 older adults will get shingles if they are unvaccinated.     This vaccine is very important for your health This vaccine is indicated for anyone 50 years or older. You can get this vaccine even if you have already had shingles because you can get the disease more than once in a lifetime.  Your risk for shingles and its complications increases with age.  This vaccine has 2 doses.  The second dose would be 2 to 6 months after the first dose.  If you had Zostavax  vaccine in the past you should still get Shingrix. ( Zostavax is only 70% effective and it loses significant strength over a few years .)  This vaccine is given through the pharmacy.  The cost of the vaccine is through your insurance. The pharmacy can inform you of the total costs.  Common side effects including soreness in the arm, some redness and swelling, also some feel fatigue muscle soreness headache low-grade fever.  Side effects typically go away within 2 to 3 days. Remember-the pain from shingles can last a lifetime but these side effects of the vaccine will only last a few days at most. It is very important to get both doses in order to protect yourself fully.   Please get this vaccine at your earliest convenience at your trusted pharmacy.

## 2020-05-29 NOTE — Progress Notes (Signed)
Subjective:    Patient ID: Andrew Manning, male    DOB: Sep 25, 1955, 64 y.o.   MRN: [id number]  HPI The patient comes in today for a wellness visit.    A review of their health history was completed.  A review of medications was also completed.  Any needed refills; yes  Eating habits: trying to eat good  Falls/  MVA accidents in past few months: none  Regular exercise: try to do some walking  Specialist pt sees on regular basis: Dermatology  Preventative health issues were discussed.   Additional concerns: Patient states he is scheduled for Covid Booster tomorrow at pharmacy    Review of Systems  Constitutional: Negative for diaphoresis and fatigue.  HENT: Negative for congestion and rhinorrhea.   Respiratory: Negative for cough and shortness of breath.   Cardiovascular: Negative for chest pain and leg swelling.  Gastrointestinal: Negative for abdominal pain and diarrhea.  Skin: Negative for color change and rash.  Neurological: Negative for dizziness and headaches.  Psychiatric/Behavioral: Negative for behavioral problems and confusion.       Objective:   Physical Exam Vitals reviewed.  Constitutional:      General: He is not in acute distress.    Appearance: He is well-nourished.  HENT:     Head: Normocephalic and atraumatic.  Eyes:     General:        Right eye: No discharge.        Left eye: No discharge.  Neck:     Trachea: No tracheal deviation.  Cardiovascular:     Rate and Rhythm: Normal rate and regular rhythm.     Heart sounds: Normal heart sounds. No murmur heard.   Pulmonary:     Effort: Pulmonary effort is normal. No respiratory distress.     Breath sounds: Normal breath sounds.  Musculoskeletal:        General: No edema.  Lymphadenopathy:     Cervical: No cervical adenopathy.  Skin:    General: Skin is warm and dry.  Neurological:     Mental Status: He is alert.     Coordination: Coordination normal.  Psychiatric:        Mood  and Affect: Mood and affect normal.        Behavior: Behavior normal.    Results for orders placed or performed in visit on 12/28/19  Lipid panel  Result Value Ref Range   Cholesterol, Total 141 100 - 199 mg/dL   Triglycerides 828 0 - 149 mg/dL   HDL 35 (L) >00 mg/dL   VLDL Cholesterol Cal 25 5 - 40 mg/dL   LDL Chol Calc (NIH) 81 0 - 99 mg/dL   Chol/HDL Ratio 4.0 0.0 - 5.0 ratio  Hepatic function panel  Result Value Ref Range   Total Protein 6.6 6.0 - 8.5 g/dL   Albumin 4.3 3.8 - 4.8 g/dL   Bilirubin Total 0.3 0.0 - 1.2 mg/dL   Bilirubin, Direct 3.49 0.00 - 0.40 mg/dL   Alkaline Phosphatase 86 44 - 121 IU/L   AST 20 0 - 40 IU/L   ALT 25 0 - 44 IU/L  PSA  Result Value Ref Range   Prostate Specific Ag, Serum 0.7 0.0 - 4.0 ng/mL  Basic metabolic panel  Result Value Ref Range   Glucose 101 (H) 65 - 99 mg/dL   BUN 13 8 - 27 mg/dL   Creatinine, Ser 1.79 0.76 - 1.27 mg/dL   GFR calc non Af Amer 71 >59 mL/min/1.73  GFR calc Af Amer 82 >59 mL/min/1.73   BUN/Creatinine Ratio 12 10 - 24   Sodium 141 134 - 144 mmol/L   Potassium 3.3 (L) 3.5 - 5.2 mmol/L   Chloride 98 96 - 106 mmol/L   CO2 26 20 - 29 mmol/L   Calcium 9.2 8.6 - 10.2 mg/dL    Prostate exam normal Potassium slightly low we will reduce the diuretic to 12.5 Blood pressure looks very good Cholesterol profile overall looks good Kidney function looks good PSA normal       Assessment & Plan:  1. Well adult exam Adult wellness-complete.wellness physical was conducted today. Importance of diet and exercise were discussed in detail.  In addition to this a discussion regarding safety was also covered. We also reviewed over immunizations and gave recommendations regarding current immunization needed for age.  In addition to this additional areas were also touched on including: Preventative health exams needed:  Colonoscopy next 06/2027  Patient was advised yearly wellness exam   2. HTN (hypertension), benign Blood  pressure good control actually looks very good today reduce HCTZ phone 25 mg 12 point 5 repeat labs in 6 months - pantoprazole (PROTONIX) 40 MG tablet; Take 1 tablet (40 mg total) by mouth daily.  Dispense: 90 tablet; Refill: 1  3. Gastroesophageal reflux disease without esophagitis Continue acid blocker on a regular basis.  Patient states it does benefit him - pantoprazole (PROTONIX) 40 MG tablet; Take 1 tablet (40 mg total) by mouth daily.  Dispense: 90 tablet; Refill: 1  4. Prostate cancer screening Patient's prostate exam looks good PSA looks good recheck this again in 1 year - pantoprazole (PROTONIX) 40 MG tablet; Take 1 tablet (40 mg total) by mouth daily.  Dispense: 90 tablet; Refill: 1  5. Morbid obesity (HCC) Portion control regular physical activity recommended  Follow-up 6 months

## 2020-06-25 ENCOUNTER — Other Ambulatory Visit: Payer: Self-pay | Admitting: Family Medicine

## 2020-09-19 ENCOUNTER — Other Ambulatory Visit: Payer: Self-pay

## 2020-09-19 ENCOUNTER — Ambulatory Visit (INDEPENDENT_AMBULATORY_CARE_PROVIDER_SITE_OTHER): Payer: BC Managed Care – PPO | Admitting: Family Medicine

## 2020-09-19 DIAGNOSIS — U071 COVID-19: Secondary | ICD-10-CM | POA: Diagnosis not present

## 2020-09-19 NOTE — Progress Notes (Signed)
   Subjective:    Patient ID: Andrew Manning, male    DOB: 13-May-1956, 65 y.o.   MRN: [id number]  Cough This is a new problem. The current episode started in the past 7 days. Associated symptoms include nasal congestion.  Fever cough congestion symptoms over the past several days denies wheezing difficulty breathing O2 saturations 96% patient does have underlying risk factors is vaccinated and boosted Covid positive - tested positive times 2 today   Review of Systems  Respiratory: Positive for cough.    Please see above    Objective:   Physical Exam  Lungs clear heart regular pulse normal HEENT benign Keep isolated from his wife warning signs discussed in detail     Assessment & Plan:  COVID Warning signs discussed Paxlovid prescribed hold off on pravastatin while on medicine Warning signs regarding hypoxia passing out difficulty breathing or worse go to ER Follow-up if ongoing troubles

## 2020-09-19 NOTE — Patient Instructions (Signed)

## 2020-11-02 ENCOUNTER — Other Ambulatory Visit: Payer: Self-pay | Admitting: Family Medicine

## 2020-11-25 ENCOUNTER — Encounter: Payer: Self-pay | Admitting: Family Medicine

## 2020-11-25 ENCOUNTER — Other Ambulatory Visit: Payer: Self-pay

## 2020-11-25 ENCOUNTER — Ambulatory Visit: Payer: BC Managed Care – PPO | Admitting: Family Medicine

## 2020-11-25 VITALS — BP 120/74 | HR 64 | Temp 98.1°F | Ht 68.5 in | Wt 264.6 lb

## 2020-11-25 DIAGNOSIS — I10 Essential (primary) hypertension: Secondary | ICD-10-CM | POA: Diagnosis not present

## 2020-11-25 DIAGNOSIS — E785 Hyperlipidemia, unspecified: Secondary | ICD-10-CM

## 2020-11-25 DIAGNOSIS — K219 Gastro-esophageal reflux disease without esophagitis: Secondary | ICD-10-CM | POA: Diagnosis not present

## 2020-11-25 DIAGNOSIS — Z125 Encounter for screening for malignant neoplasm of prostate: Secondary | ICD-10-CM

## 2020-11-25 MED ORDER — MELOXICAM 15 MG PO TABS: 15.0000 mg | ORAL_TABLET | Freq: Every day | ORAL | 1 refills | Status: DC

## 2020-11-25 MED ORDER — HYDROCHLOROTHIAZIDE 12.5 MG PO CAPS: 12.5000 mg | ORAL_CAPSULE | Freq: Every day | ORAL | 1 refills | Status: DC

## 2020-11-25 MED ORDER — LOSARTAN POTASSIUM 100 MG PO TABS: ORAL_TABLET | ORAL | 1 refills | Status: DC

## 2020-11-25 MED ORDER — PANTOPRAZOLE SODIUM 40 MG PO TBEC: 40.0000 mg | DELAYED_RELEASE_TABLET | Freq: Every day | ORAL | 1 refills | Status: DC

## 2020-11-25 MED ORDER — PRAVASTATIN SODIUM 20 MG PO TABS: 20.0000 mg | ORAL_TABLET | Freq: Every day | ORAL | 1 refills | Status: DC

## 2020-11-25 NOTE — Progress Notes (Signed)
   Subjective:    Patient ID: Andrew Manning, male    DOB: Sep 24, 1955, 65 y.o.   MRN: [id number]  Hypertension This is a chronic problem. The current episode started more than 1 year ago. Risk factors for coronary artery disease include dyslipidemia and male gender. Treatments tried: losartan, hctz.  HTN (hypertension), benign - Plan: pantoprazole (PROTONIX) 40 MG tablet, Basic metabolic panel  Hyperlipidemia, unspecified hyperlipidemia type - Plan: Basic metabolic panel  Gastroesophageal reflux disease without esophagitis - Plan: pantoprazole (PROTONIX) 40 MG tablet  Prostate cancer screening - Plan: pantoprazole (PROTONIX) 40 MG tablet    Review of Systems     Objective:   Physical Exam  General-in no acute distress Eyes-no discharge Lungs-respiratory rate normal, CTA CV-no murmurs,RRR Extremities skin warm dry no edema Neuro grossly normal Behavior normal, alert       Assessment & Plan:   1. HTN (hypertension), benign Blood pressure good control continue current measures watch diet continue to exercise try to bring weight down.  If he continues to lose weight may be able to bring down the medication to stop HCTZ.  Check potassium because it was low on last visit - pantoprazole (PROTONIX) 40 MG tablet; Take 1 tablet (40 mg total) by mouth daily.  Dispense: 90 tablet; Refill: 1 - Basic metabolic panel  2. Hyperlipidemia, unspecified hyperlipidemia type Watch diet stay active, no need to do lab work at this time - Basic metabolic panel  3. Gastroesophageal reflux disease without esophagitis Continue acid blocker patient states he has trouble if he stops taking it - pantoprazole (PROTONIX) 40 MG tablet; Take 1 tablet (40 mg total) by mouth daily.  Dispense: 90 tablet; Refill: 1  4. Prostate cancer screening Patient is up-to-date on prostate cancer screening wellness visit in December with extensive lab work - pantoprazole (PROTONIX) 40 MG tablet; Take 1 tablet (40  mg total) by mouth daily.  Dispense: 90 tablet; Refill: 1   Patient takes Mobic on a regular basis for his joints have encouraged him to try skipping some days to see if he truly needs it.  In addition to this I recommend that he continue to lose weight because this will help his joint pains  We did discuss how anti-inflammatories can cause renal insufficiency, GI bleeds, increased risk for cardiovascular but currently right now he does need a medication to help him with joint discomfort so therefore it is a balance currently  Wellness visit with labs in 6 months

## 2020-11-26 ENCOUNTER — Encounter: Payer: Self-pay | Admitting: Family Medicine

## 2020-11-26 LAB — BASIC METABOLIC PANEL
BUN/Creatinine Ratio: 13 (ref 10–24)
BUN: 13 mg/dL (ref 8–27)
CO2: 26 mmol/L (ref 20–29)
Calcium: 9.4 mg/dL (ref 8.6–10.2)
Chloride: 100 mmol/L (ref 96–106)
Creatinine, Ser: 0.99 mg/dL (ref 0.76–1.27)
Glucose: 102 mg/dL — ABNORMAL HIGH (ref 65–99)
Potassium: 4.3 mmol/L (ref 3.5–5.2)
Sodium: 140 mmol/L (ref 134–144)
eGFR: 85 mL/min/{1.73_m2} (ref >59)

## 2021-03-03 ENCOUNTER — Other Ambulatory Visit: Payer: Self-pay | Admitting: Occupational Medicine

## 2021-03-03 ENCOUNTER — Other Ambulatory Visit: Payer: Self-pay

## 2021-03-03 ENCOUNTER — Ambulatory Visit: Payer: Self-pay

## 2021-03-03 DIAGNOSIS — M545 Low back pain, unspecified: Secondary | ICD-10-CM

## 2021-05-01 ENCOUNTER — Ambulatory Visit (INDEPENDENT_AMBULATORY_CARE_PROVIDER_SITE_OTHER): Payer: BC Managed Care – PPO | Admitting: Nurse Practitioner

## 2021-05-01 ENCOUNTER — Encounter: Payer: Self-pay | Admitting: Nurse Practitioner

## 2021-05-01 ENCOUNTER — Encounter: Payer: Self-pay | Admitting: Family Medicine

## 2021-05-01 ENCOUNTER — Other Ambulatory Visit: Payer: Self-pay

## 2021-05-01 VITALS — BP 143/79 | HR 81 | Temp 96.8°F | Wt 267.4 lb

## 2021-05-01 DIAGNOSIS — A084 Viral intestinal infection, unspecified: Secondary | ICD-10-CM

## 2021-05-01 NOTE — Progress Notes (Signed)
   Subjective:    Patient ID: Andrew Manning, male    DOB: 01-19-1956, 65 y.o.   MRN: [id number]  HPI Pt had diarrhea and slight headache last couple of days. Pt states he is feeling better today. No vomiting but did feel nauseated yesterday.  Patient presents with complaints of an intestinal virus that began 2 days ago.  His wife had a similar illness 2 days before this.  Episodes of diarrhea.  Nausea but no vomiting.  No fever, some chills.  Has not had any diarrhea so far today.  Taking fluids well.  Voiding normal limit.  No cough runny nose sore throat.  Intense headache at the beginning but this is mild today.       Objective:   Physical Exam NAD.  Alert, oriented.  Lungs clear.  Heart regular rate rhythm.  Abdomen soft nondistended with minimal lower abdominal tenderness.  No rebound or guarding.  No obvious masses.  Today's Vitals   05/01/21 1446  BP: (!) 143/79  Pulse: 81  Temp: (!) 96.8 F (36 C)  SpO2: 96%  Weight: 267 lb 6.4 oz (121.3 kg)   Body mass index is 40.07 kg/m.        Assessment & Plan:  Viral gastroenteritis Discussed dietary measures to help with diarrhea. Discussed fluid intake to avoid dehydration. Expect continued gradual resolution of symptoms. Warning signs reviewed.  Call back early next week if persists,  go to ED or urgent care over the weekend if worse.

## 2021-05-03 ENCOUNTER — Encounter: Payer: Self-pay | Admitting: Nurse Practitioner

## 2021-05-27 ENCOUNTER — Encounter: Payer: Self-pay | Admitting: Family Medicine

## 2021-05-27 ENCOUNTER — Ambulatory Visit: Payer: BC Managed Care – PPO | Admitting: Family Medicine

## 2021-05-27 ENCOUNTER — Other Ambulatory Visit: Payer: Self-pay

## 2021-05-27 VITALS — BP 134/72 | HR 88 | Temp 97.9°F | Ht 68.5 in | Wt 275.0 lb

## 2021-05-27 DIAGNOSIS — E785 Hyperlipidemia, unspecified: Secondary | ICD-10-CM | POA: Diagnosis not present

## 2021-05-27 DIAGNOSIS — K219 Gastro-esophageal reflux disease without esophagitis: Secondary | ICD-10-CM

## 2021-05-27 DIAGNOSIS — I10 Essential (primary) hypertension: Secondary | ICD-10-CM

## 2021-05-27 DIAGNOSIS — Z125 Encounter for screening for malignant neoplasm of prostate: Secondary | ICD-10-CM

## 2021-05-27 DIAGNOSIS — Z23 Encounter for immunization: Secondary | ICD-10-CM

## 2021-05-27 MED ORDER — MELOXICAM 15 MG PO TABS: 15.0000 mg | ORAL_TABLET | Freq: Every day | ORAL | 1 refills | Status: DC

## 2021-05-27 MED ORDER — LOSARTAN POTASSIUM 100 MG PO TABS: ORAL_TABLET | ORAL | 1 refills | Status: DC

## 2021-05-27 MED ORDER — PANTOPRAZOLE SODIUM 40 MG PO TBEC: 40.0000 mg | DELAYED_RELEASE_TABLET | Freq: Every day | ORAL | 1 refills | Status: DC

## 2021-05-27 MED ORDER — HYDROCHLOROTHIAZIDE 12.5 MG PO CAPS: 12.5000 mg | ORAL_CAPSULE | Freq: Every day | ORAL | 1 refills | Status: DC

## 2021-05-27 MED ORDER — PRAVASTATIN SODIUM 20 MG PO TABS: 20.0000 mg | ORAL_TABLET | Freq: Every day | ORAL | 1 refills | Status: DC

## 2021-05-27 NOTE — Progress Notes (Signed)
° °  Subjective:    Patient ID: Andrew Manning, male    DOB: Apr 23, 1956, 65 y.o.   MRN: [id number]  Hypertension This is a chronic problem. Treatments tried: HCTZ, Losartan.  Hld folllow up  Hyperlipidemia, unspecified hyperlipidemia type - Plan: Basic metabolic panel, Lipid panel, PSA, Hepatic function panel  HTN (hypertension), benign - Plan: pantoprazole (PROTONIX) 40 MG tablet, Basic metabolic panel, Lipid panel, PSA, Hepatic function panel  Gastroesophageal reflux disease without esophagitis - Plan: pantoprazole (PROTONIX) 40 MG tablet, Basic metabolic panel, Lipid panel, PSA, Hepatic function panel  Prostate cancer screening - Plan: pantoprazole (PROTONIX) 40 MG tablet, Basic metabolic panel, Lipid panel, PSA, Hepatic function panel  Immunization due - Plan: Flu Vaccine QUAD 61mo+IM (Fluarix, Fluzone & Alfiuria Quad PF), Pneumococcal conjugate vaccine 20-valent (Prevnar 20)  Patient states recently he has not been able to eat as healthy as he would like.  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 HTN (hypertension), benign Blood pressure good control continue current measures - pantoprazole (PROTONIX) 40 MG tablet; Take 1 tablet (40 mg total) by mouth daily.  Dispense: 90 tablet; Refill: 1 - Basic metabolic panel - Lipid panel - PSA - Hepatic function panel  2. Gastroesophageal reflux disease without esophagitis Takes medicine for reflux states without it he has difficult time with - pantoprazole (PROTONIX) 40 MG tablet; Take 1 tablet (40 mg total) by mouth daily.  Dispense: 90 tablet; Refill: 1 - Basic  metabolic panel - Lipid panel - PSA - Hepatic function panel  3. Prostate cancer screening Screening prostate exam normal PSA ordered - pantoprazole (PROTONIX) 40 MG tablet; Take 1 tablet (40 mg total) by mouth daily.  Dispense: 90 tablet; Refill: 1 - Basic metabolic panel - Lipid panel - PSA - Hepatic function panel  4. Hyperlipidemia, unspecified hyperlipidemia type Cholesterol continue medication check labs await results - Basic metabolic panel - Lipid panel - PSA - Hepatic function panel  5. Immunization due Pneumonia vaccine flu shot today - Flu Vaccine QUAD 75mo+IM (Fluarix, Fluzone & Alfiuria Quad PF) - Pneumococcal conjugate vaccine 20-valent (Prevnar 20)  6. Morbid obesity (HCC) Healthy eating portion control regular activity Follow-up 6 months

## 2021-05-27 NOTE — Patient Instructions (Signed)

## 2021-05-28 LAB — HEPATIC FUNCTION PANEL
ALT: 22 IU/L (ref 0–44)
AST: 26 IU/L (ref 0–40)
Albumin: 4.5 g/dL (ref 3.8–4.8)
Alkaline Phosphatase: 91 IU/L (ref 44–121)
Bilirubin Total: 0.5 mg/dL (ref 0.0–1.2)
Bilirubin, Direct: 0.13 mg/dL (ref 0.00–0.40)
Total Protein: 6.7 g/dL (ref 6.0–8.5)

## 2021-05-28 LAB — LIPID PANEL
Chol/HDL Ratio: 3.6 ratio (ref 0.0–5.0)
Cholesterol, Total: 135 mg/dL (ref 100–199)
HDL: 37 mg/dL — ABNORMAL LOW (ref >39)
LDL Chol Calc (NIH): 81 mg/dL (ref 0–99)
Triglycerides: 87 mg/dL (ref 0–149)
VLDL Cholesterol Cal: 17 mg/dL (ref 5–40)

## 2021-05-28 LAB — BASIC METABOLIC PANEL
BUN/Creatinine Ratio: 12 (ref 10–24)
BUN: 11 mg/dL (ref 8–27)
CO2: 23 mmol/L (ref 20–29)
Calcium: 9.4 mg/dL (ref 8.6–10.2)
Chloride: 98 mmol/L (ref 96–106)
Creatinine, Ser: 0.93 mg/dL (ref 0.76–1.27)
Glucose: 109 mg/dL — ABNORMAL HIGH (ref 70–99)
Potassium: 3.9 mmol/L (ref 3.5–5.2)
Sodium: 139 mmol/L (ref 134–144)
eGFR: 91 mL/min/{1.73_m2} (ref >59)

## 2021-05-28 LAB — PSA: Prostate Specific Ag, Serum: 0.8 ng/mL (ref 0.0–4.0)

## 2021-05-30 ENCOUNTER — Other Ambulatory Visit: Payer: Self-pay | Admitting: Family Medicine

## 2021-05-30 DIAGNOSIS — I10 Essential (primary) hypertension: Secondary | ICD-10-CM

## 2021-05-30 DIAGNOSIS — K219 Gastro-esophageal reflux disease without esophagitis: Secondary | ICD-10-CM

## 2021-05-30 DIAGNOSIS — Z125 Encounter for screening for malignant neoplasm of prostate: Secondary | ICD-10-CM

## 2021-11-25 ENCOUNTER — Ambulatory Visit: Payer: BC Managed Care – PPO | Admitting: Family Medicine

## 2021-11-25 VITALS — BP 144/84 | HR 69 | Temp 97.3°F | Ht 68.5 in | Wt 274.0 lb

## 2021-11-25 DIAGNOSIS — K219 Gastro-esophageal reflux disease without esophagitis: Secondary | ICD-10-CM | POA: Diagnosis not present

## 2021-11-25 DIAGNOSIS — Z125 Encounter for screening for malignant neoplasm of prostate: Secondary | ICD-10-CM

## 2021-11-25 DIAGNOSIS — E785 Hyperlipidemia, unspecified: Secondary | ICD-10-CM

## 2021-11-25 DIAGNOSIS — M109 Gout, unspecified: Secondary | ICD-10-CM

## 2021-11-25 DIAGNOSIS — I10 Essential (primary) hypertension: Secondary | ICD-10-CM

## 2021-11-25 DIAGNOSIS — L409 Psoriasis, unspecified: Secondary | ICD-10-CM

## 2021-11-25 MED ORDER — HYDROCHLOROTHIAZIDE 25 MG PO TABS: 25.0000 mg | ORAL_TABLET | Freq: Every day | ORAL | 3 refills | Status: DC

## 2021-11-25 MED ORDER — TRIAMCINOLONE ACETONIDE 0.1 % EX CREA: TOPICAL_CREAM | CUTANEOUS | 6 refills | Status: DC

## 2021-11-25 MED ORDER — MELOXICAM 15 MG PO TABS: 15.0000 mg | ORAL_TABLET | Freq: Every day | ORAL | 1 refills | Status: DC

## 2021-11-25 MED ORDER — PRAVASTATIN SODIUM 20 MG PO TABS: 20.0000 mg | ORAL_TABLET | Freq: Every day | ORAL | 1 refills | Status: DC

## 2021-11-25 MED ORDER — LOSARTAN POTASSIUM 100 MG PO TABS: ORAL_TABLET | ORAL | 1 refills | Status: DC

## 2021-11-25 MED ORDER — PANTOPRAZOLE SODIUM 40 MG PO TBEC: 40.0000 mg | DELAYED_RELEASE_TABLET | Freq: Every day | ORAL | 1 refills | Status: DC

## 2021-11-25 NOTE — Patient Instructions (Signed)
Low-Purine Eating Plan A low-purine eating plan involves making food choices to limit your purine intake. Purine is a kind of uric acid. Too much uric acid in your blood can cause certain conditions, such as gout and kidney stones. Eating a low-purine diet may help control these conditions. What are tips for following this plan? Shopping Avoid buying products that contain high-fructose corn syrup. Check for this on food labels. It is commonly found in many processed foods and soft drinks. Be sure to check for it in baked goods such as cookies, canned fruits, and cereals and cereal bars. Avoid buying veal, chicken breast with skin, lamb, and organ meats such as liver. These types of meats tend to have the highest purine content. Choose dairy products. These may lower uric acid levels. Avoid certain types of fish. Not all fish and seafood have high purine content. Examples with high purine content include anchovies, trout, tuna, sardines, and salmon. Avoid buying beverages that contain alcohol, particularly beer and hard liquor. Alcohol can affect the way your body gets rid of uric acid. Meal planning  Learn which foods do or do not affect you. If you find out that a food tends to cause your gout symptoms to flare up, avoid eating that food. You can enjoy foods that do not cause problems. If you have any questions about a food item, talk with your dietitian or health care provider. Reduce the overall amount of meat in your diet. When you do eat meat, choose ones with lower purine content. Include plenty of fruits and vegetables. Although some vegetables may have a high purine content--such as asparagus, mushrooms, spinach, or cauliflower--it has been shown that these do not contribute to uric acid blood levels as much. Consume at least 1 dairy serving a day. This has been shown to decrease uric acid levels. General information If you drink alcohol: Limit how much you have to: 0-1 drink a day for  women who are not pregnant. 0-2 drinks a day for men. Know how much alcohol is in a drink. In the [place] one drink equals one 12 oz bottle of beer (355 mL), one 5 oz glass of wine (148 mL), or one 1 oz glass of hard liquor (44 mL). Drink plenty of water. Try to drink enough to keep your urine pale yellow. Fluids can help remove uric acid from your body. Work with your health care provider and dietitian to develop a plan to achieve or maintain a healthy weight. Losing weight may help reduce uric acid in your blood. What foods are recommended? The following are some types of foods that are good choices when limiting purine intake: Fresh or frozen fruits and vegetables. Whole grains, breads, cereals, and pasta. Rice. Beans, peas, legumes. Nuts and seeds. Dairy products. Fats and oils. The items listed above may not be a complete list. Talk with a dietitian about what dietary choices are best for you. What foods are not recommended? Limit your intake of foods high in purines, including: Beer and other alcohol. Meat-based gravy or sauce. Canned or fresh fish, such as: Anchovies, sardines, herring, salmon, and tuna. Mussels and scallops. Codfish, trout, and haddock. Bacon, veal, chicken breast with skin, and lamb. Organ meats, such as: Liver or kidney. Tripe. Sweetbreads (thymus gland or pancreas). Wild game or goose. Yeast or yeast extract supplements. Drinks sweetened with high-fructose corn syrup, such as soda. Processed foods made with high-fructose corn syrup. The items listed above may not be a complete list of foods   and beverages you should limit. Contact a dietitian for more information. Summary Eating a low-purine diet may help control conditions caused by too much uric acid in the body, such as gout or kidney stones. Choose low-purine foods, limit alcohol, and limit high-fructose corn syrup. You will learn over time which foods do or do not affect you. If you find out that a  food tends to cause your gout symptoms to flare up, avoid eating that food. This information is not intended to replace advice given to you by your health care provider. Make sure you discuss any questions you have with your health care provider. Document Revised: 04/30/2021 Document Reviewed: 04/30/2021 Elsevier Patient Education  2023 [employer]  

## 2021-11-25 NOTE — Progress Notes (Signed)
   Subjective:    Patient ID: Andrew Manning, male    DOB: [birth year], 66 y.o.   MRN: [id number]  HPI Hyperlipidemia, unspecified hyperlipidemia type - Plan: Lipid panel, Hepatic function panel, Basic metabolic panel, Uric Acid  HTN (hypertension), benign - Plan: Lipid panel, Hepatic function panel, Basic metabolic panel, Uric Acid  Gastroesophageal reflux disease without esophagitis - Plan: pantoprazole (PROTONIX) 40 MG tablet, Lipid panel, Hepatic function panel, Basic metabolic panel, Uric Acid  Prostate cancer screening - Plan: Lipid panel, Hepatic function panel, Basic metabolic panel, Uric Acid  Gout, unspecified cause, unspecified chronicity, unspecified site - Plan: Lipid panel, Hepatic function panel, Basic metabolic panel, Uric Acid  Psoriasis  Morbid obesity (HCC), Chronic  He admits his diet could be better.  Also admits that he needs to work harder on minimizing sodas and increasing physical activity and walking He does relate psoriasis on the right knee Review of Systems     Objective:   Physical Exam General-in no acute distress Eyes-no discharge Lungs-respiratory rate normal, CTA CV-no murmurs,RRR Extremities skin warm dry no edema Neuro grossly normal Behavior normal, alert  Psoriasis on the right knee      Assessment & Plan:  1. HTN (hypertension), benign Blood pressure slightly elevated compared to where we would like to see if Bump up dose of HCTZ to 25 mg 1 daily Patient to watch closely on blood pressures Watch salt stay active try to fit in walking Follow-up in approximately 4 to 6 weeks to recheck blood pressure - pantoprazole (PROTONIX) 40 MG tablet; Take 1 tablet (40 mg total) by mouth daily.  Dispense: 90 tablet; Refill: 1 - Lipid panel - Hepatic function panel - Basic metabolic panel - Uric Acid  2. Gastroesophageal reflux disease without esophagitis Continue acid blocker refills given - pantoprazole (PROTONIX) 40 MG tablet; Take 1  tablet (40 mg total) by mouth daily.  Dispense: 90 tablet; Refill: 1 - Lipid panel - Hepatic function panel - Basic metabolic panel - Uric Acid  3. Prostate cancer screening PSA was checked earlier this year we will check again this fall - pantoprazole (PROTONIX) 40 MG tablet; Take 1 tablet (40 mg total) by mouth daily.  Dispense: 90 tablet; Refill: 1 - Lipid panel - Hepatic function panel - Basic metabolic panel - Uric Acid  4. Hyperlipidemia, unspecified hyperlipidemia type Check lipid profile continue medication watch diet - Lipid panel - Hepatic function panel - Basic metabolic panel - Uric Acid  5. Gout, unspecified cause, unspecified chronicity, unspecified site Patient had foot surgery was told he had gout in the MTP joint we will check uric acid level low purine diet discussed - Lipid panel - Hepatic function panel - Basic metabolic panel - Uric Acid  6. Psoriasis Triamcinolone cream apply twice daily as needed  7. Morbid obesity (HCC) Portion control regular physical activity

## 2021-11-25 NOTE — Progress Notes (Signed)
   Subjective:    Patient ID: Andrew Manning, male    DOB: March 23, 1956, 66 y.o.   MRN: [id number]  Hypertension This is a chronic problem. Treatments tried: losartan, hctz.      Review of Systems     Objective:   Physical Exam        Assessment & Plan:

## 2021-11-26 ENCOUNTER — Other Ambulatory Visit: Payer: Self-pay | Admitting: Family Medicine

## 2021-11-26 DIAGNOSIS — K219 Gastro-esophageal reflux disease without esophagitis: Secondary | ICD-10-CM

## 2021-11-26 LAB — HEPATIC FUNCTION PANEL
ALT: 28 IU/L (ref 0–44)
AST: 26 IU/L (ref 0–40)
Albumin: 4.4 g/dL (ref 3.8–4.8)
Alkaline Phosphatase: 85 IU/L (ref 44–121)
Bilirubin Total: 0.4 mg/dL (ref 0.0–1.2)
Bilirubin, Direct: 0.1 mg/dL (ref 0.00–0.40)
Total Protein: 6.9 g/dL (ref 6.0–8.5)

## 2021-11-26 LAB — BASIC METABOLIC PANEL
BUN/Creatinine Ratio: 13 (ref 10–24)
BUN: 13 mg/dL (ref 8–27)
CO2: 26 mmol/L (ref 20–29)
Calcium: 9.4 mg/dL (ref 8.6–10.2)
Chloride: 103 mmol/L (ref 96–106)
Creatinine, Ser: 1.03 mg/dL (ref 0.76–1.27)
Glucose: 104 mg/dL — ABNORMAL HIGH (ref 70–99)
Potassium: 3.8 mmol/L (ref 3.5–5.2)
Sodium: 142 mmol/L (ref 134–144)
eGFR: 81 mL/min/{1.73_m2} (ref >59)

## 2021-11-26 LAB — LIPID PANEL
Chol/HDL Ratio: 3.3 ratio (ref 0.0–5.0)
Cholesterol, Total: 122 mg/dL (ref 100–199)
HDL: 37 mg/dL — ABNORMAL LOW (ref >39)
LDL Chol Calc (NIH): 67 mg/dL (ref 0–99)
Triglycerides: 91 mg/dL (ref 0–149)
VLDL Cholesterol Cal: 18 mg/dL (ref 5–40)

## 2021-11-26 LAB — URIC ACID: Uric Acid: 6.8 mg/dL (ref 3.8–8.4)

## 2021-12-25 ENCOUNTER — Other Ambulatory Visit: Payer: Self-pay | Admitting: *Deleted

## 2021-12-25 MED ORDER — MELOXICAM 15 MG PO TABS
15.0000 mg | ORAL_TABLET | Freq: Every day | ORAL | 1 refills | Status: DC
Start: 2021-12-25 — End: 2022-05-07

## 2022-01-05 ENCOUNTER — Ambulatory Visit: Payer: BC Managed Care – PPO | Admitting: Family Medicine

## 2022-01-05 ENCOUNTER — Other Ambulatory Visit: Payer: Self-pay | Admitting: Family Medicine

## 2022-01-05 VITALS — BP 130/80 | Ht 68.5 in | Wt 273.0 lb

## 2022-01-05 DIAGNOSIS — I10 Essential (primary) hypertension: Secondary | ICD-10-CM

## 2022-01-05 DIAGNOSIS — Z79899 Other long term (current) drug therapy: Secondary | ICD-10-CM

## 2022-01-05 DIAGNOSIS — E785 Hyperlipidemia, unspecified: Secondary | ICD-10-CM

## 2022-01-05 DIAGNOSIS — T63444A Toxic effect of venom of bees, undetermined, initial encounter: Secondary | ICD-10-CM | POA: Diagnosis not present

## 2022-01-05 DIAGNOSIS — Z125 Encounter for screening for malignant neoplasm of prostate: Secondary | ICD-10-CM

## 2022-01-05 NOTE — Progress Notes (Signed)
01/05/22- lab orders placed

## 2022-01-05 NOTE — Progress Notes (Signed)
   Subjective:    Patient ID: Andrew Manning, male    DOB: Jul 11, 1955, 66 y.o.   MRN: [id number]  Hypertension This is a chronic problem. The current episode started more than 1 year ago. Risk factors for coronary artery disease include dyslipidemia. Treatments tried: HCTZ, losartan.   Patient states he had a bee sting 3 weeks ago and his neck been bothering him since the sting Sting on the left hand swelling is gone down there is no swelling in any other place no wheezing or difficulty breathing  He relates some intermittent discomfort in the wound base of his neck left and right ever since having the bee stings  Review of Systems     Objective:   Physical Exam  General-in no acute distress Eyes-no discharge Lungs-respiratory rate normal, CTA CV-no murmurs,RRR Extremities skin warm dry no edema Neuro grossly normal Behavior normal, alert  I find no evidence of any swelling in the neck due to the bee sting.  He has had previous neck surgery.  Has slight decreased range of motion but this could be associated with previous neck issues     Assessment & Plan:   1. HTN (hypertension), benign Blood pressure good control with current HCTZ recommend follow-up December or January.  No lab work indicated currently but do lab work before next follow-up  2. Bee sting, undetermined intent, initial encounter This was a localized reaction.  There was no systemic reaction.  EpiPen is optional.  Patient defers on EpiPen.

## 2022-02-04 ENCOUNTER — Other Ambulatory Visit: Payer: Self-pay | Admitting: *Deleted

## 2022-02-04 MED ORDER — PRAVASTATIN SODIUM 20 MG PO TABS: 20.0000 mg | ORAL_TABLET | Freq: Every day | ORAL | 0 refills | Status: DC

## 2022-05-07 ENCOUNTER — Ambulatory Visit: Payer: BC Managed Care – PPO | Admitting: Family Medicine

## 2022-05-07 VITALS — BP 128/72 | HR 75 | Temp 98.2°F | Ht 68.4 in | Wt 265.0 lb

## 2022-05-07 DIAGNOSIS — J019 Acute sinusitis, unspecified: Secondary | ICD-10-CM | POA: Diagnosis not present

## 2022-05-07 DIAGNOSIS — Z23 Encounter for immunization: Secondary | ICD-10-CM

## 2022-05-07 DIAGNOSIS — E785 Hyperlipidemia, unspecified: Secondary | ICD-10-CM | POA: Diagnosis not present

## 2022-05-07 DIAGNOSIS — Z79899 Other long term (current) drug therapy: Secondary | ICD-10-CM | POA: Diagnosis not present

## 2022-05-07 DIAGNOSIS — I10 Essential (primary) hypertension: Secondary | ICD-10-CM | POA: Diagnosis not present

## 2022-05-07 DIAGNOSIS — K219 Gastro-esophageal reflux disease without esophagitis: Secondary | ICD-10-CM

## 2022-05-07 DIAGNOSIS — Z125 Encounter for screening for malignant neoplasm of prostate: Secondary | ICD-10-CM | POA: Diagnosis not present

## 2022-05-07 MED ORDER — MELOXICAM 15 MG PO TABS: 15.0000 mg | ORAL_TABLET | Freq: Every day | ORAL | 1 refills | Status: DC

## 2022-05-07 MED ORDER — PRAVASTATIN SODIUM 20 MG PO TABS: 20.0000 mg | ORAL_TABLET | Freq: Every day | ORAL | 1 refills | Status: DC

## 2022-05-07 MED ORDER — LOSARTAN POTASSIUM 100 MG PO TABS: ORAL_TABLET | ORAL | 1 refills | Status: DC

## 2022-05-07 MED ORDER — PANTOPRAZOLE SODIUM 40 MG PO TBEC: 40.0000 mg | DELAYED_RELEASE_TABLET | Freq: Every day | ORAL | 1 refills | Status: DC

## 2022-05-07 MED ORDER — AMOXICILLIN-POT CLAVULANATE 875-125 MG PO TABS: 1.0000 | ORAL_TABLET | Freq: Two times a day (BID) | ORAL | 0 refills | Status: DC

## 2022-05-07 NOTE — Progress Notes (Signed)
Subjective:    Patient ID: Andrew Manning, male    DOB: 1956/01/08, 66 y.o.   MRN: [id number]  Hypertension This is a chronic problem. Treatments tried: hctz, losartan.  HTN (hypertension), benign  Hyperlipidemia, unspecified hyperlipidemia type  Acute rhinosinusitis  Gastroesophageal reflux disease without esophagitis - Plan: pantoprazole (PROTONIX) 40 MG tablet  Immunization due - Plan: Flu Vaccine QUAD 81moIM (Fluarix, Fluzone & Alfiuria Quad PF) Acute rhinosinusitis symptoms head congestion for past couple weeks drainage coughing sinus pressure not feeling good denies high fever chills sweats wheezing  Patient has had nasal congestion and post nasal drainage for 1 week  No other symptoms   States that he does not take his reflux medicine he gets heartburn he does get some intermittent regurgitation he is trying to watch how he eats  Patient for blood pressure check up.  The patient does have hypertension.   Patient relates dietary measures try to minimize salt The importance of healthy diet and activity were discussed Patient relates compliance  Patient here for follow-up regarding cholesterol.    Patient relates taking medication on a regular basis Denies problems with medication Importance of dietary measures discussed Regular lab work regarding lipid and liver was checked and if needing additional labs was appropriately ordered  Review of Systems     Objective:   Physical Exam Gen-NAD not toxic TMS-normal bilateral T- normal no redness Chest-CTA respiratory rate normal no crackles CV RRR no murmur Skin-warm dry Neuro-grossly normal  Results for orders placed or performed in visit on 11/25/21  Lipid panel  Result Value Ref Range   Cholesterol, Total 122 100 - 199 mg/dL   Triglycerides 91 0 - 149 mg/dL   HDL 37 (L) >39 mg/dL   VLDL Cholesterol Cal 18 5 - 40 mg/dL   LDL Chol Calc (NIH) 67 0 - 99 mg/dL   Chol/HDL Ratio 3.3 0.0 - 5.0 ratio  Hepatic  function panel  Result Value Ref Range   Total Protein 6.9 6.0 - 8.5 g/dL   Albumin 4.4 3.8 - 4.8 g/dL   Bilirubin Total 0.4 0.0 - 1.2 mg/dL   Bilirubin, Direct 0.10 0.00 - 0.40 mg/dL   Alkaline Phosphatase 85 44 - 121 IU/L   AST 26 0 - 40 IU/L   ALT 28 0 - 44 IU/L  Basic metabolic panel  Result Value Ref Range   Glucose 104 (H) 70 - 99 mg/dL   BUN 13 8 - 27 mg/dL   Creatinine, Ser 1.03 0.76 - 1.27 mg/dL   eGFR 81 >59 mL/min/1.73   BUN/Creatinine Ratio 13 10 - 24   Sodium 142 134 - 144 mmol/L   Potassium 3.8 3.5 - 5.2 mmol/L   Chloride 103 96 - 106 mmol/L   CO2 26 20 - 29 mmol/L   Calcium 9.4 8.6 - 10.2 mg/dL  Uric Acid  Result Value Ref Range   Uric Acid 6.8 3.8 - 8.4 mg/dL         Assessment & Plan:  1. HTN (hypertension), benign HTN- patient seen for follow-up regarding HTN.   Diet, medication compliance, appropriate labs and refills were completed.   Importance of keeping blood pressure under good control to lessen the risk of complications discussed Regular follow-up visits discussed   2. Hyperlipidemia, unspecified hyperlipidemia type Hyperlipidemia-importance of diet, weight control, activity, compliance with medications discussed.   Recent labs reviewed.   Any additional labs or refills ordered.   Importance of keeping under good control discussed. Regular follow-up  visits discussed   3. Acute rhinosinusitis Antibiotics prescribed warning signs discussed this is been going on for past couple weeks unlikely to be COVID or flu  4. Gastroesophageal reflux disease without esophagitis Healthy diet portion control regular activity along with minimizing caffeine's chocolates. - pantoprazole (PROTONIX) 40 MG tablet; Take 1 tablet (40 mg total) by mouth daily.  Dispense: 90 tablet; Refill: 1  5. Immunization due Flu shot today - Flu Vaccine QUAD 71moIM (Fluarix, Fluzone & Alfiuria Quad PF)

## 2022-05-08 LAB — LIPID PANEL
Chol/HDL Ratio: 3.9 ratio (ref 0.0–5.0)
Cholesterol, Total: 128 mg/dL (ref 100–199)
HDL: 33 mg/dL — ABNORMAL LOW (ref >39)
LDL Chol Calc (NIH): 77 mg/dL (ref 0–99)
Triglycerides: 93 mg/dL (ref 0–149)
VLDL Cholesterol Cal: 18 mg/dL (ref 5–40)

## 2022-05-08 LAB — HEPATIC FUNCTION PANEL
ALT: 20 IU/L (ref 0–44)
AST: 20 IU/L (ref 0–40)
Albumin: 4.3 g/dL (ref 3.9–4.9)
Alkaline Phosphatase: 84 IU/L (ref 44–121)
Bilirubin Total: 0.4 mg/dL (ref 0.0–1.2)
Bilirubin, Direct: 0.12 mg/dL (ref 0.00–0.40)
Total Protein: 6.6 g/dL (ref 6.0–8.5)

## 2022-05-08 LAB — PSA: Prostate Specific Ag, Serum: 0.5 ng/mL (ref 0.0–4.0)

## 2022-05-08 LAB — BASIC METABOLIC PANEL
BUN/Creatinine Ratio: 12 (ref 10–24)
BUN: 11 mg/dL (ref 8–27)
CO2: 27 mmol/L (ref 20–29)
Calcium: 9.2 mg/dL (ref 8.6–10.2)
Chloride: 98 mmol/L (ref 96–106)
Creatinine, Ser: 0.9 mg/dL (ref 0.76–1.27)
Glucose: 110 mg/dL — ABNORMAL HIGH (ref 70–99)
Potassium: 3.7 mmol/L (ref 3.5–5.2)
Sodium: 141 mmol/L (ref 134–144)
eGFR: 94 mL/min/{1.73_m2} (ref >59)

## 2022-05-09 ENCOUNTER — Encounter: Payer: Self-pay | Admitting: Family Medicine

## 2022-05-09 NOTE — Progress Notes (Signed)
Please mail to the patient 

## 2022-06-14 ENCOUNTER — Ambulatory Visit: Payer: BC Managed Care – PPO | Admitting: Family Medicine

## 2022-06-14 VITALS — BP 120/68 | HR 67 | Temp 98.6°F | Ht 68.4 in | Wt 264.0 lb

## 2022-06-14 DIAGNOSIS — R112 Nausea with vomiting, unspecified: Secondary | ICD-10-CM | POA: Diagnosis not present

## 2022-06-14 DIAGNOSIS — R197 Diarrhea, unspecified: Secondary | ICD-10-CM | POA: Diagnosis not present

## 2022-06-14 MED ORDER — ONDANSETRON HCL 4 MG PO TABS: 4.0000 mg | ORAL_TABLET | Freq: Three times a day (TID) | ORAL | 0 refills | Status: AC | PRN

## 2022-06-14 NOTE — Progress Notes (Signed)
Subjective:  Patient ID: Andrew Manning, male    DOB: 06-May-1956  Age: 67 y.o. MRN: [id number]  CC: Chief Complaint  Patient presents with   Diarrhea    And vomiting intermittently since Oct 2023 or later and worsening , stomach indigestion sounds    HPI:  67 year old male presents for evaluation of the above.  Patient reports that since October he has had 4-5 bouts of nausea, vomiting, and diarrhea.  Patient reports that his most recent episode was yesterday.  He has had nausea and has had 1 episode of emesis.  6-7 bouts of diarrhea.  He reports some associated lower abdominal cramping when he is going to have diarrhea.  No fever.  He is feeling well today.  He has had no further diarrhea today.  He reports that he has had changes in his diet to try and be more healthy.  He is try to stay more active.  No other changes or exposures.  No other associated symptoms.  No other complaints.  Patient Active Problem List   Diagnosis Date Noted   Nausea vomiting and diarrhea 06/14/2022   GERD (gastroesophageal reflux disease) 06/17/2015   Morbid obesity ([place]) 12/07/2013   HTN (hypertension), benign 06/05/2013   Hyperlipidemia 06/05/2013   Hyperglycemia 06/05/2013    Social Hx   Social History   Socioeconomic History   Marital status: Married    Spouse name: Not on file   Number of children: Not on file   Years of education: Not on file   Highest education level: Not on file  Occupational History   Not on file  Tobacco Use   Smoking status: Never   Smokeless tobacco: Never  Vaping Use   Vaping Use: Never used  Substance and Sexual Activity   Alcohol use: No   Drug use: No   Sexual activity: Yes  Other Topics Concern   Not on file  Social History Narrative   Not on file   Social Determinants of Health   Financial Resource Strain: Not on file  Food Insecurity: Not on file  Transportation Needs: Not on file  Physical Activity: Not on file  Stress: Not on file   Social Connections: Not on file    Review of Systems Per HPI  Objective:  BP 120/68   Pulse 67   Temp 98.6 F (37 C)   Ht 5' 8.4" (1.737 m)   Wt 264 lb (119.7 kg)   SpO2 96%   BMI 39.67 kg/m      06/14/2022    2:18 PM 05/07/2022    8:51 AM 05/07/2022    8:30 AM  BP/Weight  Systolic BP 761 950 932  Diastolic BP 68 72 74  Wt. (Lbs) 264  265  BMI 39.67 kg/m2  39.82 kg/m2    Physical Exam Vitals and nursing note reviewed.  Constitutional:      General: He is not in acute distress.    Appearance: Normal appearance. He is obese.  Eyes:     General:        Right eye: No discharge.        Left eye: No discharge.     Conjunctiva/sclera: Conjunctivae normal.  Cardiovascular:     Rate and Rhythm: Normal rate and regular rhythm.  Pulmonary:     Effort: Pulmonary effort is normal.     Breath sounds: Normal breath sounds. No wheezing, rhonchi or rales.  Abdominal:     General: There is no distension.  Palpations: Abdomen is soft.     Tenderness: There is no abdominal tenderness.  Neurological:     Mental Status: He is alert.     Lab Results  Component Value Date   WBC 10.4 02/02/2018   HGB 15.0 02/02/2018   HCT 42.5 02/02/2018   PLT 324 02/02/2018   GLUCOSE 110 (H) 05/07/2022   CHOL 128 05/07/2022   TRIG 93 05/07/2022   HDL 33 (L) 05/07/2022   LDLCALC 77 05/07/2022   ALT 20 05/07/2022   AST 20 05/07/2022   NA 141 05/07/2022   K 3.7 05/07/2022   CL 98 05/07/2022   CREATININE 0.90 05/07/2022   BUN 11 05/07/2022   CO2 27 05/07/2022   PSA 0.46 08/11/2013   INR 1.0 10/31/2007   HGBA1C 5.2 06/14/2017     Assessment & Plan:   Problem List Items Addressed This Visit       Digestive   Nausea vomiting and diarrhea - Primary    Exam benign.  Has had recent labs.  Zofran as needed.  Referring to GI.  Supportive care.      Relevant Orders   Ambulatory referral to Gastroenterology    Meds ordered this encounter  Medications   ondansetron (ZOFRAN) 4  MG tablet    Sig: Take 1 tablet (4 mg total) by mouth every 8 (eight) hours as needed for nausea or vomiting.    Dispense:  20 tablet    Refill:  0    Follow-up:  Return if symptoms worsen or fail to improve.  [place]

## 2022-06-14 NOTE — Patient Instructions (Signed)
Medication as need.  Watch diet.  Referral to GI.  Take care  Dr. Lacinda Axon

## 2022-06-14 NOTE — Assessment & Plan Note (Signed)
Exam benign.  Has had recent labs.  Zofran as needed.  Referring to GI.  Supportive care.

## 2022-06-15 ENCOUNTER — Encounter: Payer: Self-pay | Admitting: Gastroenterology

## 2022-06-28 NOTE — Progress Notes (Unsigned)
GI Office Note    Referring Provider: Coral Spikes, DO Primary Care Physician:  Kathyrn Drown, MD  Primary Gastroenterologist: Cristopher Estimable.Rourk, MD  Chief Complaint   Chief Complaint  Patient presents with   Emesis    Vomiting and diarrhea. Stomach rumbling all the time and gassy.      History of Present Illness   Andrew Manning is a 67 y.o. male presenting today at the request of Coral Spikes, DO for nausea, vomiting, and diarrhea.   Last colonoscopy 08/03/2017: -Sigmoid and descending diverticulosis -Exam otherwise normal -Repeat in 10 years  No prior EGD on file.  Labs 05/07/2022: Lipid panel normal, slightly low HDL at 33.  LFTs within normal limits.  BMP normal other than elevated glucose.   Per review of chart patient presented to PCP office 06/14/2022 with complaint of 4-5 episodes of diarrhea, nausea, and vomiting since October.  Reported 1 episode of emesis with nausea and 6 or 7 bouts of diarrhea with associated lower abdominal cramping when he has diarrhea. Reported last episode 06/13/22. Denies any fevers.  No diarrhea on day of exam.  Had made changes to his diet trying to eat more healthy and staying more active.  Exam reported to be benign.  Given Zofran for nausea and advised supportive care.  Given referral to GI.   Today: Diarrhea, Nausea: Happening since September. Becoming more prominent. Will have nausea associated with it and an upset stomach when it gets severe. Reports some issues with fattier meals and having diarrhea and then he had stomach pain and had nausea and after he threw up he felt better. Diarrhea continued. Does not typically have looser stools. Still has his gallbladder. Had appendectomy at age 49 - spent 21 days in the hospital. This has been periodic. Looser stools have been every few days. Lower abdomen is very rumbly and having more gas than usual. Trying to lose weight. Wife on Ozempic so they are eating less and trying to be mindful of  what he eats. Eating more crackers/breads, etc. Does eat cheese. May have a sandwich instead of a regular meal. Has not been super hungry. Cutting back on soft drinks. Tried pepto before and had some relief during that time. Abdominal pain comes randomly. Always lower. And more on left side. Has improvement of pain after defecation. Usually foes once per day. Stools are loose for a few days then form back up a little. No particular foods but possibly greasier foods.   Has chronic GERD. When first had it he had severe chest pain. Had a cardiac work up that was negative. Has been on PPI for about 10 years or so. Takes ppi once daily.  Will get a sour belch at night at times and that is occurring more. Does have some dysphagia since neck surgery. Sometimes occurs with foods and sometimes his big multivitamins.  Had neck surgery about 10 years or so ago and had vertebrae fused.   Had COVID in September/first October. Had diarrhea wit this as well. Has had COVID 3 times.    Current Outpatient Medications  Medication Sig Dispense Refill   hydrochlorothiazide (HYDRODIURIL) 25 MG tablet Take 1 tablet (25 mg total) by mouth daily. 90 tablet 3   loratadine (CLARITIN) 10 MG tablet Take 10 mg by mouth daily.     losartan (COZAAR) 100 MG tablet TAKE 1 TABLET BY MOUTH ONCE DAILY. 90 tablet 1   meloxicam (MOBIC) 15 MG tablet Take 1 tablet (  15 mg total) by mouth daily. 90 tablet 1   Multiple Vitamin (MULTIVITAMIN) tablet Take 1 tablet by mouth daily.     ondansetron (ZOFRAN) 4 MG tablet Take 1 tablet (4 mg total) by mouth every 8 (eight) hours as needed for nausea or vomiting. 20 tablet 0   pantoprazole (PROTONIX) 40 MG tablet Take 1 tablet (40 mg total) by mouth daily. 90 tablet 1   pravastatin (PRAVACHOL) 20 MG tablet Take 1 tablet (20 mg total) by mouth daily. 90 tablet 1   No current facility-administered medications for this visit.    Past Medical History:  Diagnosis Date   Cellulitis    GERD  (gastroesophageal reflux disease)    Hypercholesteremia    Hypertension    Seasonal allergies     Past Surgical History:  Procedure Laterality Date   CERVICAL FUSION     COLONOSCOPY N/A 08/03/2017   Procedure: COLONOSCOPY;  Surgeon: Daneil Dolin, MD;  Location: [facility];  Service: Endoscopy;  Laterality: N/A;  10:30   KNEE ARTHROSCOPY     lipoma removal     from back   SHOULDER ARTHROSCOPY      Family History  Problem Relation Age of Onset   Cancer Mother        uterine   Cancer Father        lung cancer   Hypertension Father    Hypertension Sister    Hypertension Brother    Stroke Paternal Grandfather    Colon cancer Neg Hx     Allergies as of 06/29/2022   (No Known Allergies)    Social History   Socioeconomic History   Marital status: Married    Spouse name: Not on file   Number of children: Not on file   Years of education: Not on file   Highest education level: Not on file  Occupational History   Not on file  Tobacco Use   Smoking status: Never   Smokeless tobacco: Never  Vaping Use   Vaping Use: Never used  Substance and Sexual Activity   Alcohol use: No   Drug use: No   Sexual activity: Yes  Other Topics Concern   Not on file  Social History Narrative   Not on file   Social Determinants of Health   Financial Resource Strain: Not on file  Food Insecurity: Not on file  Transportation Needs: Not on file  Physical Activity: Not on file  Stress: Not on file  Social Connections: Not on file  Intimate Partner Violence: Not on file     Review of Systems   Gen: Denies any fever, chills, fatigue, weight loss, lack of appetite.  CV: Denies chest pain, heart palpitations, peripheral edema, syncope.  Resp: Denies shortness of breath at rest or with exertion. Denies wheezing or cough.  GI: see HPI GU : Denies urinary burning, urinary frequency, urinary hesitancy MS: Denies joint pain, muscle weakness, cramps, or limitation of movement.   Derm: Denies rash, itching, dry skin Psych: Denies depression, anxiety, memory loss, and confusion Heme: Denies bruising, bleeding, and enlarged lymph nodes.   Physical Exam   BP (!) 152/77 (BP Location: Right Arm, Patient Position: Sitting, Cuff Size: Large)   Pulse 73   Temp 97.9 F (36.6 C) (Temporal)   Ht 5\' 9"  (1.753 m)   Wt 271 lb 9.6 oz (123.2 kg)   SpO2 96%   BMI 40.11 kg/m   General:   Alert and oriented. Pleasant and cooperative. Well-nourished and  well-developed.  Head:  Normocephalic and atraumatic. Eyes:  Without icterus, sclera clear and conjunctiva pink.  Ears:  Normal auditory acuity. Mouth:  No deformity or lesions, oral mucosa pink.  Lungs:  Clear to auscultation bilaterally. No wheezes, rales, or rhonchi. No distress.  Heart:  S1, S2 present without murmurs appreciated.  Abdomen:  +BS, soft, non-tender and non-distended. No HSM noted. No guarding or rebound. No masses appreciated.  Rectal:  Deferred  Msk:  Symmetrical without gross deformities. Normal posture. Extremities:  Without edema. Neurologic:  Alert and  oriented x4;  grossly normal neurologically. Skin:  Intact without significant lesions or rashes. Psych:  Alert and cooperative. Normal mood and affect.   Assessment   Andrew Manning is a 67 y.o. male with a history of GERD, hypercholesteremia, HTN presenting today for evaluation of nausea, vomiting, diarrhea.  Nausea/vomiting, Diarrhea: Symptoms since September but reports becoming more prominent. Symptoms are intermittent and possibly related to fattier meals. Previously with some relief of diarrhea when taking pepto. Stools vary from formed for a few days and then a few days of diarrhea. States he had COVID in September when this all started and has had bouts of diarrhea previously when having COVID. Has occasional abdominal rumbling and flatulence as well. Last colonoscopy normal in 2019. Symptoms appear to be possible post infectious IBS so I  have recommended dicyclomine as needed and low FODMAP diet. No indication for stools studies at this time. May consider further workup with TSH, CRP, celiac serologies if diet change and dicyclomine not helpful. Advised use of anti-gas medication for symptom relief as well.   GERD, Dysphagia: History of chronic GERD with previous negative chest pain workup. Symptoms typically controlled however has had more belching at night with reflux. Does have some more frequent dysphagia with solids and larger medications. Reports previous neck surgery with anterior approach. Discussed performing EGD with possible dilation in the future and surveillance for barrett's esophagus which we will discuss further at follow up as patient would like to trial medication first. Will add famotidine 20 mg nightly to his PPI regimen.   Hypertension: The patient was found to have elevated blood pressure (158/77) when vital signs were checked in the office. The blood pressure was rechecked by the nursing staff and it was found be persistently elevated >140/90 mmHg. I personally advised to the patient to follow up closely with PCP for hypertension control.    PLAN   Low FODMAP diet, separate handout provided. Imodium if > 3 loose stools Beano, Gas-ex, phazyme as needed Dicyclomine 10 mg twice daily as needed for diarrhea and abdominal cramping.  Famotidine 20 mg nightly.  Continue pantoprazole 40 mg daily.  GERD diet/lifestyle modifications Talk about scheduling EGD for barrett's surveillance and dysphagia at follow up.  Follow up with PCP for BP management Follow-up in 2 months, or sooner if needed. May complete celiac serologies, tsh, crp at next visit and discuss colonoscopy if diarrhea continues.    Brooke Bonito, MSN, FNP-BC, AGACNP-BC [facility]

## 2022-06-29 ENCOUNTER — Telehealth: Payer: Self-pay

## 2022-06-29 ENCOUNTER — Ambulatory Visit: Payer: BC Managed Care – PPO | Admitting: Gastroenterology

## 2022-06-29 ENCOUNTER — Encounter: Payer: Self-pay | Admitting: Gastroenterology

## 2022-06-29 VITALS — BP 134/80 | HR 73 | Temp 97.9°F | Ht 69.0 in | Wt 271.6 lb

## 2022-06-29 DIAGNOSIS — R1310 Dysphagia, unspecified: Secondary | ICD-10-CM

## 2022-06-29 DIAGNOSIS — K589 Irritable bowel syndrome without diarrhea: Secondary | ICD-10-CM

## 2022-06-29 DIAGNOSIS — I10 Essential (primary) hypertension: Secondary | ICD-10-CM

## 2022-06-29 DIAGNOSIS — K219 Gastro-esophageal reflux disease without esophagitis: Secondary | ICD-10-CM | POA: Diagnosis not present

## 2022-06-29 DIAGNOSIS — R195 Other fecal abnormalities: Secondary | ICD-10-CM | POA: Diagnosis not present

## 2022-06-29 DIAGNOSIS — R112 Nausea with vomiting, unspecified: Secondary | ICD-10-CM

## 2022-06-29 MED ORDER — DICYCLOMINE HCL 10 MG PO CAPS: 10.0000 mg | ORAL_CAPSULE | Freq: Two times a day (BID) | ORAL | 1 refills | Status: DC | PRN

## 2022-06-29 NOTE — Telephone Encounter (Signed)
90 day supply sent to pharmacy in December 2023. Left message to return call to inform pt

## 2022-06-29 NOTE — Telephone Encounter (Signed)
Pt returned call and verbalized understanding  

## 2022-06-29 NOTE — Telephone Encounter (Signed)
Encourage patient to contact the pharmacy for refills or they can request refills through [facility]  (Please schedule appointment if patient has not been seen in over a year)    WHAT [place] TO: [facility] [place]   MEDICATION NAME & DOSE: meloxicam (MOBIC) 15 MG tablet   NOTES/COMMENTS FROM PATIENT  Front office please notify patient: It takes 48-72 hours to process rx refill requests Ask patient to call pharmacy to ensure rx is ready before heading there.

## 2022-06-29 NOTE — Patient Instructions (Addendum)
Continue pantoprazole 40 mg daily.  You may benefit from famotidine (Pepcid) 20 mg nightly, you may obtain this over-the-counter.  Follow a GERD diet:  Avoid fried, fatty, greasy, spicy, citrus foods. Avoid caffeine and carbonated beverages. Avoid chocolate. Try eating 4-6 small meals a day rather than 3 large meals. Do not eat within 3 hours of laying down. Prop head of bed up on wood or bricks to create a 6 inch incline.  As we discussed, I suspect he may be dealing with postinfectious IBS post-COVID infection.  I would encourage you to follow a low FODMAP diet as well as a lower fat diet.  I am attaching a handout regarding FODMAP diet for you.  For some of the gas discomfort and frequent flatulence you may take Beano, Gas-X, or Phazyme as needed.  If worse more severe abdominal cramping associated with your looser stools I am providing you with dicyclomine that you may take up to twice a day as needed, please reserve for severe cramping.  If you begin to need this more frequently please monitor for any constipation, vision changes, dry mouth, or confusion.  We will plan to follow-up in 2 months, or sooner if needed.  It was a pleasure to see you today. I want to create trusting relationships with patients. If you receive a survey regarding your visit,  I greatly appreciate you taking time to fill this out on paper or through your MyChart. I value your feedback.  Venetia Night, MSN, FNP-BC, AGACNP-BC [facility]

## 2022-06-29 NOTE — Telephone Encounter (Signed)
Encourage patient to contact the pharmacy for refills or they can request refills through [facility]  (Please schedule appointment if patient has not been seen in over a year)    WHAT PHARMACY WOULD THEY LIKE THIS SENT TO: [facility] ([place]) 100 MG tablet   NOTES/COMMENTS FROM PATIENT   Front office please notify patient: It takes 48-72 hours to process rx refill requests Ask patient to call pharmacy to ensure rx is ready before heading there.

## 2022-06-29 NOTE — Telephone Encounter (Signed)
90 day supply sent to pharmacy in Dec 2023

## 2022-08-04 ENCOUNTER — Other Ambulatory Visit: Payer: Self-pay | Admitting: *Deleted

## 2022-08-04 MED ORDER — PRAVASTATIN SODIUM 20 MG PO TABS: 20.0000 mg | ORAL_TABLET | Freq: Every day | ORAL | 0 refills | Status: DC

## 2022-09-28 ENCOUNTER — Encounter: Payer: Self-pay | Admitting: Gastroenterology

## 2022-09-28 ENCOUNTER — Ambulatory Visit (INDEPENDENT_AMBULATORY_CARE_PROVIDER_SITE_OTHER): Payer: BC Managed Care – PPO | Admitting: Gastroenterology

## 2022-09-28 VITALS — BP 135/79 | HR 73 | Temp 97.5°F | Ht 69.0 in | Wt 265.8 lb

## 2022-09-28 DIAGNOSIS — R1310 Dysphagia, unspecified: Secondary | ICD-10-CM | POA: Diagnosis not present

## 2022-09-28 DIAGNOSIS — K219 Gastro-esophageal reflux disease without esophagitis: Secondary | ICD-10-CM | POA: Diagnosis not present

## 2022-09-28 DIAGNOSIS — R195 Other fecal abnormalities: Secondary | ICD-10-CM

## 2022-09-28 NOTE — Progress Notes (Signed)
GI Office Note    Referring Provider: Babs Sciara, MD Primary Care Physician:  Babs Sciara, MD Primary Gastroenterologist: Gerrit Friends.Rourk, MD  Date:  09/28/2022  ID:  Andrew Manning, Andrew Manning Sep 27, 1955, MRN [id number]   Chief Complaint   Chief Complaint  Patient presents with   Follow-up    Patient here for a follow up. Patient says the loose stools have subsided. He says he thinks his issues with loose stools were related to popcorn.   History of Present Illness  Andrew Manning is a 67 y.o. male with a history of GERD, hypercholesteremia, HTN presenting today for follow-up of diarrhea and GERD.  Last colonoscopy 08/03/2017: -Sigmoid and descending diverticulosis -Exam otherwise normal -Repeat in 10 years   No prior EGD on file.   Labs 05/07/2022: Lipid panel normal, slightly low HDL at 33.  LFTs within normal limits.  BMP normal other than elevated glucose.   Last office visit 06/29/22.  Diarrhea becoming more prominent.  Will have nausea associated with his upset stomach as well.  Issues with fattier meals.  Gallbladder intact.  Appendectomy at age 42.  Working on losing weight and eating better.  Has tried Pepto with some mild relief of diarrhea.  Abdominal pain comes randomly and usually on the lower left side.  Stools usually are loose for few days then has improvement and then is back to looser stools.  Has chronic GERD.  Used to have severe chest pain.  Had 5 previous negative cardiac workup.  PPI for about 10 years, taking once daily.  Sour belch at night that is worsening.  Does have some dysphagia since neck surgery and issues with taking big vitamins.  Reportedly has had COVID 3 times.  Advise low FODMAP diet, Imodium as needed, Gas-X as needed.  Use dicyclomine twice daily as needed for diarrhea and abdominal cramping.  Continue PPI daily and famotidine nightly.  Plan to discuss EGD for Barrett's surveillance and dysphagia follow-up.  If ongoing issues we will  complete celiac serologies, TSH, and CRP.   Today: Feels as though when he gets to the end of the popcorn back it tastes more oily and feels as though it is oil that is upsetting his stomach. In the last 2 weeks he has not had any loose stools. Has only taken about 6 dicyclomine and that has been helpful.   GERD much more controlled since being on the pepcid.  Feels as though he may have gotten used to pantoprazole given-omeprazole.  Denies any nausea or vomiting.  Still occasionally having issues with dysphagia with a being stuck in the base of his neck which is the site where he had prior surgery.   Current Outpatient Medications  Medication Sig Dispense Refill   dicyclomine (BENTYL) 10 MG capsule Take 1 capsule (10 mg total) by mouth 2 (two) times daily as needed for spasms (diarrhea and abdominal cramping). 30 capsule 1   famotidine (PEPCID) 20 MG tablet Take 20 mg by mouth at bedtime.     hydrochlorothiazide (HYDRODIURIL) 25 MG tablet Take 1 tablet (25 mg total) by mouth daily. 90 tablet 3   loratadine (CLARITIN) 10 MG tablet Take 10 mg by mouth daily.     losartan (COZAAR) 100 MG tablet TAKE 1 TABLET BY MOUTH ONCE DAILY. 90 tablet 1   meloxicam (MOBIC) 15 MG tablet Take 1 tablet (15 mg total) by mouth daily. 90 tablet 1   Multiple Vitamin (MULTIVITAMIN) tablet Take 1 tablet by mouth  daily.     ondansetron (ZOFRAN) 4 MG tablet Take 1 tablet (4 mg total) by mouth every 8 (eight) hours as needed for nausea or vomiting. 20 tablet 0   pantoprazole (PROTONIX) 40 MG tablet Take 1 tablet (40 mg total) by mouth daily. 90 tablet 1   pravastatin (PRAVACHOL) 20 MG tablet Take 1 tablet (20 mg total) by mouth daily. 90 tablet 0   No current facility-administered medications for this visit.    Past Medical History:  Diagnosis Date   Cellulitis    GERD (gastroesophageal reflux disease)    Hypercholesteremia    Hypertension    Seasonal allergies     Past Surgical History:  Procedure  Laterality Date   CERVICAL FUSION     COLONOSCOPY N/A 08/03/2017   Procedure: COLONOSCOPY;  Surgeon: Corbin Ade, MD;  Location: [facility];  Service: Endoscopy;  Laterality: N/A;  10:30   KNEE ARTHROSCOPY     lipoma removal     from back   SHOULDER ARTHROSCOPY      Family History  Problem Relation Age of Onset   Cancer Mother        uterine   Cancer Father        lung cancer   Hypertension Father    Hypertension Sister    Hypertension Brother    Stroke Paternal Grandfather    Colon cancer Neg Hx     Allergies as of 09/28/2022   (No Known Allergies)    Social History   Socioeconomic History   Marital status: Married    Spouse name: Not on file   Number of children: Not on file   Years of education: Not on file   Highest education level: Not on file  Occupational History   Not on file  Tobacco Use   Smoking status: Never   Smokeless tobacco: Never  Vaping Use   Vaping Use: Never used  Substance and Sexual Activity   Alcohol use: No   Drug use: No   Sexual activity: Yes  Other Topics Concern   Not on file  Social History Narrative   Not on file   Social Determinants of Health   Financial Resource Strain: Not on file  Food Insecurity: Not on file  Transportation Needs: Not on file  Physical Activity: Not on file  Stress: Not on file  Social Connections: Not on file     Review of Systems   Gen: Denies fever, chills, anorexia. Denies fatigue, weakness, weight loss.  CV: Denies chest pain, palpitations, syncope, peripheral edema, and claudication. Resp: Denies dyspnea at rest, cough, wheezing, coughing up blood, and pleurisy. GI: See HPI Derm: Denies rash, itching, dry skin Psych: Denies depression, anxiety, memory loss, confusion. No homicidal or suicidal ideation.  Heme: Denies bruising, bleeding, and enlarged lymph nodes.   Physical Exam   BP 135/79 (BP Location: Left Arm, Patient Position: Sitting, Cuff Size: Large)   Pulse 73   Temp (!)  97.5 F (36.4 C) (Temporal)   Ht 5\' 9"  (1.753 m)   Wt 265 lb 12.8 oz (120.6 kg)   BMI 39.25 kg/m   General:   Alert and oriented. No distress noted. Pleasant and cooperative.  Head:  Normocephalic and atraumatic. Eyes:  Conjuctiva clear without scleral icterus. Mouth:  Oral mucosa pink and moist. Good dentition. No lesions. Lungs:  Clear to auscultation bilaterally. No wheezes, rales, or rhonchi. No distress.  Heart:  S1, S2 present without murmurs appreciated.  Abdomen:  +BS,  soft, non-tender and non-distended. No rebound or guarding. No HSM or masses noted. Rectal: deferred Msk:  Symmetrical without gross deformities. Normal posture. Extremities:  Without edema. Neurologic:  Alert and  oriented x4 Psych:  Alert and cooperative. Normal mood and affect.   Assessment  DELLIS VOGHT is a 67 y.o. male with a history of GERD, hypercholesteremia, HTN presenting today for follow-up of diarrhea and GERD.  GERD, Dysphagia: Fairly well-controlled on pantoprazole 40 mg once daily and famotidine 20 mg nightly.  Possibly has developed some tolerance to pantoprazole.  Given his chronic nature of GERD and GI use he is at risk for Barrett's esophagus and has never had a screening EGD.  He has agreed to proceed therefore we will schedule to evaluate for esophagitis, gastritis, ulcer disease, duodenitis, Barrett's esophagus, esophageal ring, web, or other stenosis or stricture and will consider dilation.  For now he will continue PPI once daily and famotidine nightly.  Diarrhea: Loose stools have resolved with avoiding fattier meals and cooking oils.  Feels as though the old that is used to [employer] was upsetting his stomach.  Has not had any loose stools in about 2 weeks and has not needed dicyclomine in 2-3 weeks.  No longer having any abdominal pain/cramping or nausea or vomiting.  Advised to avoid these trigger foods and he may continue to use dicyclomine as needed if symptoms recur.  PLAN    Proceed with upper endoscopy with possible dilation  with propofol by Dr. Jena Gauss in near future: the risks, benefits, and alternatives have been discussed with the patient in detail. The patient states understanding and desires to proceed. ASA 2 BMP prior Dicyclomine as needed Continue pantoprazole 40 mg once daily.  May consider switching PPI pending EGD results. Continue famotidine 20 mg once daily.  Follow up in 3 months.     Brooke Bonito, MSN, FNP-BC, AGACNP-BC [facility]

## 2022-09-28 NOTE — Patient Instructions (Addendum)
Schedule you for an upper endoscopy with possible dilation in the near future with Dr. Jena Gauss.  Continue taking your pantoprazole 40 mg once daily in the mornings and famotidine nightly.  Pending the results of your endoscopy we may consider switching to pantoprazole to a different medication within the same class.  Follow a GERD diet Avoid fried, fatty, greasy, spicy, citrus foods. Avoid caffeine and carbonated beverages. Avoid chocolate. Try eating 4-6 small meals a day rather than 3 large meals. Do not eat within 3 hours of laying down. Prop head of bed up on wood or bricks to create a 6 inch incline.  May continue to use dicyclomine as needed if you have any recurrent abdominal cramping.  We will plan to follow-up in 3 months, sooner if needed.  This will be to reassess your swallowing.  It was a pleasure to see you today. I want to create trusting relationships with patients. If you receive a survey regarding your visit,  I greatly appreciate you taking time to fill this out on paper or through your MyChart. I value your feedback.  Brooke Bonito, MSN, FNP-BC, AGACNP-BC [facility]

## 2022-10-11 ENCOUNTER — Telehealth (INDEPENDENT_AMBULATORY_CARE_PROVIDER_SITE_OTHER): Payer: Self-pay | Admitting: Internal Medicine

## 2022-10-11 NOTE — Telephone Encounter (Signed)
Pt returned call and verbalized understanding  

## 2022-10-11 NOTE — Telephone Encounter (Signed)
Left message for pt to return call regarding pre op appt. Pre op TELEPHONE call scheduled for 11/01/22 at 8:00 am

## 2022-10-26 ENCOUNTER — Other Ambulatory Visit (HOSPITAL_COMMUNITY)
Admission: RE | Admit: 2022-10-26 | Discharge: 2022-10-26 | Disposition: A | Discharge status: Home / Self Care | Payer: BC Managed Care – PPO | Source: Ambulatory Visit | Attending: Internal Medicine | Admitting: Internal Medicine

## 2022-10-26 ENCOUNTER — Other Ambulatory Visit: Payer: Self-pay | Admitting: Gastroenterology

## 2022-10-26 DIAGNOSIS — K219 Gastro-esophageal reflux disease without esophagitis: Secondary | ICD-10-CM | POA: Diagnosis not present

## 2022-10-26 LAB — BASIC METABOLIC PANEL
Anion gap: 14 (ref 5–15)
BUN: 19 mg/dL (ref 8–23)
CO2: 24 mmol/L (ref 22–32)
Calcium: 8.5 mg/dL — ABNORMAL LOW (ref 8.9–10.3)
Chloride: 101 mmol/L (ref 98–111)
Creatinine, Ser: 1.01 mg/dL (ref 0.61–1.24)
GFR, Estimated: >60 mL/min (ref >60)
Glucose, Bld: 116 mg/dL — ABNORMAL HIGH (ref 70–99)
Potassium: 3.3 mmol/L — ABNORMAL LOW (ref 3.5–5.1)
Sodium: 139 mmol/L (ref 135–145)

## 2022-10-26 MED ORDER — POTASSIUM CHLORIDE CRYS ER 20 MEQ PO TBCR: 20.0000 meq | EXTENDED_RELEASE_TABLET | Freq: Every day | ORAL | 0 refills | Status: DC

## 2022-10-27 ENCOUNTER — Encounter: Payer: Self-pay | Admitting: *Deleted

## 2022-10-27 ENCOUNTER — Other Ambulatory Visit: Payer: Self-pay | Admitting: Gastroenterology

## 2022-11-01 ENCOUNTER — Encounter (HOSPITAL_COMMUNITY)
Admission: RE | Admit: 2022-11-01 | Discharge: 2022-11-01 | Disposition: A | Discharge status: Home / Self Care | Payer: BC Managed Care – PPO | Source: Ambulatory Visit | Attending: Internal Medicine | Admitting: Internal Medicine

## 2022-11-01 HISTORY — DX: Unspecified osteoarthritis, unspecified site: M19.90

## 2022-11-01 HISTORY — DX: Other complications of anesthesia, initial encounter: T88.59XA

## 2022-11-01 HISTORY — DX: Other specified postprocedural states: Z98.890

## 2022-11-03 ENCOUNTER — Encounter (HOSPITAL_COMMUNITY): Payer: Self-pay

## 2022-11-04 ENCOUNTER — Ambulatory Visit (HOSPITAL_COMMUNITY): Payer: BC Managed Care – PPO | Admitting: Anesthesiology

## 2022-11-04 ENCOUNTER — Ambulatory Visit (HOSPITAL_COMMUNITY)
Admission: RE | Admit: 2022-11-04 | Discharge: 2022-11-04 | Disposition: A | Discharge status: Home / Self Care | Payer: BC Managed Care – PPO | Attending: Internal Medicine | Admitting: Internal Medicine

## 2022-11-04 ENCOUNTER — Encounter (HOSPITAL_COMMUNITY)
Admission: RE | Disposition: A | Discharge status: Home / Self Care | Payer: Self-pay | Source: Home / Self Care | Attending: Internal Medicine

## 2022-11-04 ENCOUNTER — Encounter (HOSPITAL_COMMUNITY): Payer: Self-pay | Admitting: Internal Medicine

## 2022-11-04 DIAGNOSIS — K219 Gastro-esophageal reflux disease without esophagitis: Secondary | ICD-10-CM | POA: Insufficient documentation

## 2022-11-04 DIAGNOSIS — I10 Essential (primary) hypertension: Secondary | ICD-10-CM | POA: Diagnosis not present

## 2022-11-04 DIAGNOSIS — K317 Polyp of stomach and duodenum: Secondary | ICD-10-CM | POA: Diagnosis not present

## 2022-11-04 DIAGNOSIS — E7800 Pure hypercholesterolemia, unspecified: Secondary | ICD-10-CM | POA: Insufficient documentation

## 2022-11-04 DIAGNOSIS — R1310 Dysphagia, unspecified: Secondary | ICD-10-CM | POA: Insufficient documentation

## 2022-11-04 HISTORY — PX: ESOPHAGOGASTRODUODENOSCOPY (EGD) WITH PROPOFOL: SHX5813

## 2022-11-04 HISTORY — PX: MALONEY DILATION: SHX5535

## 2022-11-04 HISTORY — PX: POLYPECTOMY: SHX5525

## 2022-11-04 SURGERY — ESOPHAGOGASTRODUODENOSCOPY (EGD) WITH PROPOFOL
Anesthesia: General

## 2022-11-04 MED ORDER — PROPOFOL 500 MG/50ML IV EMUL
INTRAVENOUS | Status: DC | PRN
  Administered 2022-11-04: 150 ug/kg/min via INTRAVENOUS

## 2022-11-04 MED ORDER — LIDOCAINE HCL (CARDIAC) PF 100 MG/5ML IV SOSY
PREFILLED_SYRINGE | INTRAVENOUS | Status: DC | PRN
  Administered 2022-11-04: 50 mg via INTRAVENOUS

## 2022-11-04 MED ORDER — STERILE WATER FOR IRRIGATION IR SOLN
Status: DC | PRN
  Administered 2022-11-04: 60 mL

## 2022-11-04 MED ORDER — LACTATED RINGERS IV SOLN: INTRAVENOUS | Status: DC | PRN

## 2022-11-04 MED ORDER — PROPOFOL 10 MG/ML IV BOLUS
INTRAVENOUS | Status: DC | PRN
  Administered 2022-11-04: 100 mg via INTRAVENOUS

## 2022-11-04 NOTE — Anesthesia Preprocedure Evaluation (Signed)
Anesthesia Evaluation  Patient identified by MRN, date of birth, ID band Patient awake    Reviewed: Allergy & Precautions, H&P , NPO status , Patient's Chart, lab work & pertinent test results, reviewed documented beta blocker date and time   History of Anesthesia Complications (+) PONV and history of anesthetic complications  Airway Mallampati: II  TM Distance: >3 FB Neck ROM: full    Dental no notable dental hx.    Pulmonary neg pulmonary ROS   Pulmonary exam normal breath sounds clear to auscultation       Cardiovascular Exercise Tolerance: Good hypertension, negative cardio ROS  Rhythm:regular Rate:Normal     Neuro/Psych negative neurological ROS  negative psych ROS   GI/Hepatic negative GI ROS, Neg liver ROS,GERD  ,,  Endo/Other  negative endocrine ROS    Renal/GU negative Renal ROS  negative genitourinary   Musculoskeletal   Abdominal   Peds  Hematology negative hematology ROS (+)   Anesthesia Other Findings   Reproductive/Obstetrics negative OB ROS                             Anesthesia Physical Anesthesia Plan  ASA: 2  Anesthesia Plan: General   Post-op Pain Management:    Induction:   PONV Risk Score and Plan: Propofol infusion  Airway Management Planned:   Additional Equipment:   Intra-op Plan:   Post-operative Plan:   Informed Consent: I have reviewed the patients History and Physical, chart, labs and discussed the procedure including the risks, benefits and alternatives for the proposed anesthesia with the patient or authorized representative who has indicated his/her understanding and acceptance.     Dental Advisory Given  Plan Discussed with: CRNA  Anesthesia Plan Comments:        Anesthesia Quick Evaluation

## 2022-11-04 NOTE — Op Note (Signed)
[facility] Patient Name: Andrew Manning Procedure Date: 11/04/2022 9:31 AM MRN: [id number] Date of Birth: 04-01-1956 Attending MD: Gennette Pac , MD, [id number] CSN: [id number] Age: 67 Admit Type: Outpatient Procedure:                Upper GI endoscopy Indications:              Dysphagia Providers:                Gennette Pac, MD Referring MD:              Medicines:                Propofol per Anesthesia Complications:            No immediate complications. Estimated Blood Loss:     Estimated blood loss was minimal. Procedure:                Pre-Anesthesia Assessment:                           - Prior to the procedure, a History and Physical                            was performed, and patient medications and                            allergies were reviewed. The patient's tolerance of                            previous anesthesia was also reviewed. The risks                            and benefits of the procedure and the sedation                            options and risks were discussed with the patient.                            All questions were answered, and informed consent                            was obtained. Prior Anticoagulants: The patient has                            taken no anticoagulant or antiplatelet agents. ASA                            Grade Assessment: III - A patient with severe                            systemic disease. After reviewing the risks and                            benefits, the patient was deemed in satisfactory  condition to undergo the procedure.                           After obtaining informed consent, the endoscope was                            passed under direct vision. Throughout the                            procedure, the patient's blood pressure, pulse, and                            oxygen saturations were monitored continuously. The                            GIF-H190  ([id number]) scope was introduced through the                            mouth, and advanced to the second part of duodenum.                            The upper GI endoscopy was accomplished without                            difficulty. The patient tolerated the procedure                            well. Scope In: 9:51:01 AM Scope Out: 10:03:52 AM Total Procedure Duration: 0 hours 12 minutes 51 seconds  Findings:      Small inlet patch.      The examined esophagus was normal otherwise. Stomach empty. Scattered       hyperplastic appearing polyps 3 to 5 mm in dimensions. No ulcer or       infiltrating process. Pylorus patent.      The duodenal bulb and second portion of the duodenum were normal. Scope       was withdrawn, obtained a 56 French Maloney dilator, his mouth would not       open very wide I was unable to advance subsequently obtained a 54 [place]       Maloney dilator was able to advance to full insertion with mild       resistance better look back revealed no apparent complication. There was       no mucosal disruption there was no blood. Finally, I removed one of the       gastric polyps with cold biopsy forceps for histology.1q Impression:               - Normal esophagus (inlet patch). Status post                            [facility] dilation. Gastric polyps status post biopsy                            removal                           -  Normal duodenal bulb and second portion of the                            duodenum.                           - . Moderate Sedation:      Moderate (conscious) sedation was personally administered by an       anesthesia professional. The following parameters were monitored: oxygen       saturation, heart rate, blood pressure, and response to care. Recommendation:           - Patient has a contact number available for                            emergencies. The signs and symptoms of potential                            delayed complications  were discussed with the                            patient. Return to normal activities tomorrow.                            Written discharge instructions were provided to the                            patient.                           - Advance diet as tolerated. Follow-up on                            pathology. Stop Protonix; begin rabeprazole 20 mg                            30 minutes before breakfast daily. Office visit                            with [place] in 6 weeks. Procedure Code(s):        --- Professional ---                           [phone], Esophagogastroduodenoscopy, flexible,                            transoral; diagnostic, including collection of                            specimen(s) by brushing or washing, when performed                            (separate procedure) Diagnosis Code(s):        --- Professional ---                           R13.10, Dysphagia, unspecified  CPT copyright 2022 American Medical Association. All rights reserved. The codes documented in this report are preliminary and upon coder review may  be revised to meet current compliance requirements. Andrew Manning. Andrew Regner, MD Gennette Pac, MD 11/04/2022 10:13:51 AM This report has been signed electronically. Number of Addenda: 0

## 2022-11-04 NOTE — Discharge Instructions (Signed)
EGD Discharge instructions Please read the instructions outlined below and refer to this sheet in the next few weeks. These discharge instructions provide you with general information on caring for yourself after you leave the hospital. Your doctor may also give you specific instructions. While your treatment has been planned according to the most current medical practices available, unavoidable complications occasionally occur. If you have any problems or questions after discharge, please call your doctor. ACTIVITY You may resume your regular activity but move at a slower pace for the next 24 hours.  Take frequent rest periods for the next 24 hours.  Walking will help expel (get rid of) the air and reduce the bloated feeling in your abdomen.  No driving for 24 hours (because of the anesthesia (medicine) used during the test).  You may shower.  Do not sign any important legal documents or operate any machinery for 24 hours (because of the anesthesia used during the test).  NUTRITION Drink plenty of fluids.  You may resume your normal diet.  Begin with a light meal and progress to your normal diet.  Avoid alcoholic beverages for 24 hours or as instructed by your caregiver.  MEDICATIONS You may resume your normal medications unless your caregiver tells you otherwise.  WHAT YOU CAN EXPECT TODAY You may experience abdominal discomfort such as a feeling of fullness or "gas" pains.  FOLLOW-UP Your doctor will discuss the results of your test with you.  SEEK IMMEDIATE MEDICAL ATTENTION IF ANY OF THE FOLLOWING OCCUR: Excessive nausea (feeling sick to your stomach) and/or vomiting.  Severe abdominal pain and distention (swelling).  Trouble swallowing.  Temperature over 101 F (37.8 C).  Rectal bleeding or vomiting of blood.      1 polyp removed from your stomach.  Your esophagus was stretched today  Stop Protonix; begin Aciphex or rabeprazole 20 mg daily 30 minutes before breakfast.  A new  prescription is being sent directly to your pharmacy from my office.  Further recommendations to follow pending review of pathology report  Begin your diet with a soft meal for lunch today then advance as tolerated  I do not anticipate you are going to have much of a sore throat.  If so, may use Chloraseptic spray for transient discomfort  Office visit with [place] in 6 weeks  At patient request, I called Debbe Mounts at [phone] findings and recommendations

## 2022-11-04 NOTE — Transfer of Care (Signed)
Immediate Anesthesia Transfer of Care Note  Patient: Andrew Manning  Procedure(s) Performed: ESOPHAGOGASTRODUODENOSCOPY (EGD) WITH PROPOFOL MALONEY DILATION POLYPECTOMY  Patient Location: Short Stay  Anesthesia Type:General  Level of Consciousness: drowsy  Airway & Oxygen Therapy: Patient Spontanous Breathing  Post-op Assessment: Report given to RN and Post -op Vital signs reviewed and stable  Post vital signs: Reviewed and stable  Last Vitals:  Vitals Value Taken Time  BP 112/56 11/04/22 1008  Temp 36.4 C 11/04/22 1008  Pulse 64 11/04/22 1008  Resp 16 11/04/22 1008  SpO2 98 % 11/04/22 1008    Last Pain:  Vitals:   11/04/22 1008  TempSrc: Oral  PainSc: 0-No pain         Complications: No notable events documented.

## 2022-11-04 NOTE — H&P (Signed)
@LOGO @   Primary Care Physician:  Babs Sciara, MD Primary Gastroenterologist:  Dr. Jena Gauss  Pre-Procedure History & Physical: HPI:  Andrew Manning is a 67 y.o. male here for  further evaluation of vague esophageal dysphagia.  History of cervical spine surgery.  GERD suboptimally controlled on Protonix 40 mg daily.  Possible esophageal dilation is feasible/appropriate.  Past Medical History:  Diagnosis Date   Arthritis    Cellulitis    Complication of anesthesia    GERD (gastroesophageal reflux disease)    Hypercholesteremia    Hypertension    PONV (postoperative nausea and vomiting)    Seasonal allergies     Past Surgical History:  Procedure Laterality Date   CERVICAL FUSION     COLONOSCOPY N/A 08/03/2017   Procedure: COLONOSCOPY;  Surgeon: Corbin Ade, MD;  [facility];  Service: Endoscopy;  Laterality: N/A;  10:30   KNEE ARTHROSCOPY     lipoma removal     from back   SHOULDER ARTHROSCOPY      Prior to Admission medications   Medication Sig Start Date End Date Taking? Authorizing Provider  dicyclomine (BENTYL) 10 MG capsule Take 1 capsule (10 mg total) by mouth 2 (two) times daily as needed for spasms (diarrhea and abdominal cramping). 06/29/22  Yes Mahon, Frederik Schmidt, NP  famotidine (PEPCID) 20 MG tablet Take 20 mg by mouth at bedtime.   Yes [provider]  hydrochlorothiazide (HYDRODIURIL) 25 MG tablet Take 1 tablet (25 mg total) by mouth daily. 11/25/21  Yes Luking, Jonna Coup, MD  loratadine (CLARITIN) 10 MG tablet Take 10 mg by mouth daily.   Yes [provider]  losartan (COZAAR) 100 MG tablet TAKE 1 TABLET BY MOUTH ONCE DAILY. 05/07/22  Yes Babs Sciara, MD  meloxicam (MOBIC) 15 MG tablet Take 1 tablet (15 mg total) by mouth daily. 05/07/22  Yes Babs Sciara, MD  Multiple Vitamin (MULTIVITAMIN) tablet Take 1 tablet by mouth daily.   Yes [provider]  pantoprazole (PROTONIX) 40 MG tablet Take 1 tablet (40 mg total) by  mouth daily. 05/07/22  Yes Luking, Jonna Coup, MD  potassium chloride SA (KLOR-CON M) 20 MEQ tablet Take 1 tablet (20 mEq total) by mouth daily for 2 days. Patient not taking: Reported on 11/01/2022 10/26/22 10/28/22  Aida Raider, NP  pravastatin (PRAVACHOL) 20 MG tablet Take 1 tablet (20 mg total) by mouth daily. 08/04/22  Yes Luking, Jonna Coup, MD  ondansetron (ZOFRAN) 4 MG tablet Take 1 tablet (4 mg total) by mouth every 8 (eight) hours as needed for nausea or vomiting. 06/14/22   Tommie Sams, DO    Allergies as of 09/28/2022   (No Known Allergies)    Family History  Problem Relation Age of Onset   Cancer Mother        uterine   Cancer Father        lung cancer   Hypertension Father    Hypertension Sister    Hypertension Brother    Stroke Paternal Grandfather    Colon cancer Neg Hx     Social History   Socioeconomic History   Marital status: Married    Spouse name: Not on file   Number of children: Not on file   Years of education: Not on file   Highest education level: Not on file  Occupational History   Not on file  Tobacco Use   Smoking status: Never   Smokeless tobacco: Never  Vaping  Use   Vaping Use: Never used  Substance and Sexual Activity   Alcohol use: No   Drug use: No   Sexual activity: Yes  Other Topics Concern   Not on file  Social History Narrative   Not on file   Social Determinants of Health   Financial Resource Strain: Not on file  Food Insecurity: Not on file  Transportation Needs: Not on file  Physical Activity: Not on file  Stress: Not on file  Social Connections: Not on file  Intimate Partner Violence: Not on file    Review of Systems: See HPI, otherwise negative ROS  Physical Exam: BP 102/87 (BP Location: Right Arm)   Pulse 62   Temp 97.8 F (36.6 C) (Oral)   Resp 14   SpO2 98%  General:   Alert,  Well-developed, well-nourished, pleasant and cooperative in NAD Mouth:  No deformity or lesions. Neck:  Supple; no masses or  thyromegaly. No significant cervical adenopathy. Lungs:  Clear throughout to auscultation.   No wheezes, crackles, or rhonchi. No acute distress. Heart:  Regular rate and rhythm; no murmurs, clicks, rubs,  or gallops. Abdomen: Non-distended, normal bowel sounds.  Soft and nontender without appreciable mass or hepatosplenomegaly.    Impression/Plan:    67 year old gentleman with suboptimally controlled GERD on Protonix.  Vague esophageal dysphagia pointing to his suprasternal notch history of cervical spine surgery.  Here for EGD with possible esophageal dilation is feasible/appropriate per plan. The risks, benefits, limitations, alternatives and imponderables have been reviewed with the patient. Potential for esophageal dilation, biopsy, etc. have also been reviewed.  Questions have been answered. All parties agreeable.      Notice: This dictation was prepared with Dragon dictation along with smaller phrase technology. Any transcriptional errors that result from this process are unintentional and may not be corrected upon review.

## 2022-11-04 NOTE — Anesthesia Procedure Notes (Signed)
Date/Time: 11/04/2022 9:52 AM  Performed by: Julian Reil, CRNAPre-anesthesia Checklist: Patient identified, Emergency Drugs available, Suction available and Patient being monitored Patient Re-evaluated:Patient Re-evaluated prior to induction Oxygen Delivery Method: Nasal cannula Induction Type: IV induction Placement Confirmation: positive ETCO2

## 2022-11-05 ENCOUNTER — Encounter: Payer: Self-pay | Admitting: Internal Medicine

## 2022-11-05 LAB — SURGICAL PATHOLOGY

## 2022-11-05 NOTE — Anesthesia Postprocedure Evaluation (Signed)
Anesthesia Post Note  Patient: Andrew Manning  Procedure(s) Performed: ESOPHAGOGASTRODUODENOSCOPY (EGD) WITH PROPOFOL MALONEY DILATION POLYPECTOMY  Patient location during evaluation: Phase II Anesthesia Type: General Level of consciousness: awake Pain management: pain level controlled Vital Signs Assessment: post-procedure vital signs reviewed and stable Respiratory status: spontaneous breathing and respiratory function stable Cardiovascular status: blood pressure returned to baseline and stable Postop Assessment: no headache and no apparent nausea or vomiting Anesthetic complications: no Comments: Late entry   No notable events documented.   Last Vitals:  Vitals:   11/04/22 0803 11/04/22 1008  BP: 102/87 (!) 112/56  Pulse: 62 64  Resp: 14 16  Temp:  36.4 C  SpO2: 98% 98%    Last Pain:  Vitals:   11/04/22 1008  TempSrc: Oral  PainSc: 0-No pain                 [name]

## 2022-11-08 ENCOUNTER — Other Ambulatory Visit: Payer: Self-pay | Admitting: Gastroenterology

## 2022-11-08 ENCOUNTER — Telehealth: Payer: Self-pay | Admitting: Gastroenterology

## 2022-11-08 ENCOUNTER — Encounter: Payer: Self-pay | Admitting: *Deleted

## 2022-11-08 ENCOUNTER — Telehealth: Payer: Self-pay

## 2022-11-08 MED ORDER — RABEPRAZOLE SODIUM 20 MG PO TBEC: 20.0000 mg | DELAYED_RELEASE_TABLET | Freq: Every day | ORAL | 2 refills | Status: DC

## 2022-11-08 NOTE — Telephone Encounter (Signed)
Pt needs a follow up with an app in 6 weeks from EGD

## 2022-11-08 NOTE — Telephone Encounter (Signed)
Spoke to pt, informed him that Aciphex was sent to pharmacy. He voiced understanding.

## 2022-11-08 NOTE — Telephone Encounter (Signed)
Patient left a message that he had his procedure done and there was supposed to have been a script called in for him for Asifex (sp?) and he checked with his drugstore and he said it wasn't there.

## 2022-11-09 ENCOUNTER — Ambulatory Visit (INDEPENDENT_AMBULATORY_CARE_PROVIDER_SITE_OTHER): Payer: BC Managed Care – PPO | Admitting: Family Medicine

## 2022-11-09 ENCOUNTER — Encounter: Payer: Self-pay | Admitting: Family Medicine

## 2022-11-09 VITALS — BP 124/64 | HR 67 | Ht 69.0 in | Wt 266.2 lb

## 2022-11-09 DIAGNOSIS — I10 Essential (primary) hypertension: Secondary | ICD-10-CM

## 2022-11-09 DIAGNOSIS — Z79899 Other long term (current) drug therapy: Secondary | ICD-10-CM

## 2022-11-09 DIAGNOSIS — E876 Hypokalemia: Secondary | ICD-10-CM

## 2022-11-09 DIAGNOSIS — E785 Hyperlipidemia, unspecified: Secondary | ICD-10-CM | POA: Diagnosis not present

## 2022-11-09 DIAGNOSIS — K219 Gastro-esophageal reflux disease without esophagitis: Secondary | ICD-10-CM

## 2022-11-09 MED ORDER — HYDROCHLOROTHIAZIDE 25 MG PO TABS: 25.0000 mg | ORAL_TABLET | Freq: Every day | ORAL | 3 refills | Status: DC

## 2022-11-09 MED ORDER — LOSARTAN POTASSIUM 100 MG PO TABS: ORAL_TABLET | ORAL | 1 refills | Status: DC

## 2022-11-09 MED ORDER — MELOXICAM 15 MG PO TABS: 15.0000 mg | ORAL_TABLET | Freq: Every day | ORAL | 1 refills | Status: DC

## 2022-11-09 MED ORDER — PRAVASTATIN SODIUM 20 MG PO TABS: 20.0000 mg | ORAL_TABLET | Freq: Every day | ORAL | 1 refills | Status: DC

## 2022-11-09 NOTE — Progress Notes (Signed)
Subjective:    Patient ID: Andrew Manning, male    DOB: 18-Apr-1956, 67 y.o.   MRN: [id number]  HPI Patient arrives today for 6 month follow up.   Patient would like to discuss acid reflux medication.  Patient relates that gastroenterology recently sent a new prescription on PPI.  And apparently the cost of it is relatively high.  Also for some reason the prescription had the instructions of taking it breakfast and later in the day when he was under the impression he is only to take once per day We will try to clarify this with gastroenterology  Outpatient Encounter Medications as of 11/09/2022  Medication Sig   dicyclomine (BENTYL) 10 MG capsule Take 1 capsule (10 mg total) by mouth 2 (two) times daily as needed for spasms (diarrhea and abdominal cramping).   famotidine (PEPCID) 20 MG tablet Take 20 mg by mouth at bedtime.   loratadine (CLARITIN) 10 MG tablet Take 10 mg by mouth daily.   Multiple Vitamin (MULTIVITAMIN) tablet Take 1 tablet by mouth daily.   ondansetron (ZOFRAN) 4 MG tablet Take 1 tablet (4 mg total) by mouth every 8 (eight) hours as needed for nausea or vomiting.   potassium chloride SA (KLOR-CON M) 20 MEQ tablet Take 1 tablet (20 mEq total) by mouth daily for 2 days. (Patient not taking: Reported on 11/01/2022)   RABEprazole (ACIPHEX) 20 MG tablet Take 1 tablet (20 mg total) by mouth daily before breakfast. Please take 30 minutes before your meal.   [DISCONTINUED] hydrochlorothiazide (HYDRODIURIL) 25 MG tablet Take 1 tablet (25 mg total) by mouth daily.   [DISCONTINUED] losartan (COZAAR) 100 MG tablet TAKE 1 TABLET BY MOUTH ONCE DAILY.   [DISCONTINUED] meloxicam (MOBIC) 15 MG tablet Take 1 tablet (15 mg total) by mouth daily.   [DISCONTINUED] pravastatin (PRAVACHOL) 20 MG tablet Take 1 tablet (20 mg total) by mouth daily.   hydrochlorothiazide (HYDRODIURIL) 25 MG tablet Take 1 tablet (25 mg total) by mouth daily.   losartan (COZAAR) 100 MG tablet TAKE 1 TABLET BY MOUTH  ONCE DAILY.   meloxicam (MOBIC) 15 MG tablet Take 1 tablet (15 mg total) by mouth daily.   pravastatin (PRAVACHOL) 20 MG tablet Take 1 tablet (20 mg total) by mouth daily.   No facility-administered encounter medications on file as of 11/09/2022.    HTN (hypertension), benign - Plan: Basic Metabolic Panel (7), Microalbumin / creatinine urine ratio  Hyperlipidemia, unspecified hyperlipidemia type - Plan: Lipid panel  Gastroesophageal reflux disease without esophagitis  Hypocalcemia - Plan: Vitamin D, 25-hydroxy  Hypokalemia - Plan: Vitamin D, 25-hydroxy  High risk medication use - Plan: Basic Metabolic Panel (7), Microalbumin / creatinine urine ratio  Patient for blood pressure check up.  The patient does have hypertension.   Patient relates dietary measures try to minimize salt The importance of healthy diet and activity were discussed Patient relates compliance  Patient here for follow-up regarding cholesterol.    Patient relates taking medication on a regular basis Denies problems with medication Importance of dietary measures discussed Regular lab work regarding lipid and liver was checked and if needing additional labs was appropriately ordered  The patient's BMI is calculated.  The patient does have obesity.  The patient does try to some degree staying active and watching diet.  It is in the vital signs and acknowledged.  It is above the recommended BMI for the patient's height and weight.  The patient has been counseled regarding healthy diet, restricted portions, avoiding excessive  carbohydrates/sugary foods, and increase physical activity as health permits.  It is in the patient's best interest to lower the risk of secondary illness including heart disease strokes and cancer by losing weight.  The patient acknowledges this information.   Review of Systems     Objective:   Physical Exam General-in no acute distress Eyes-no discharge Lungs-respiratory rate normal,  CTA CV-no murmurs,RRR Extremities skin warm dry no edema Neuro grossly normal Behavior normal, alert        Assessment & Plan:  1. HTN (hypertension), benign HTN- patient seen for follow-up regarding HTN.   Diet, medication compliance, appropriate labs and refills were completed.   Importance of keeping blood pressure under good control to lessen the risk of complications discussed Regular follow-up visits discussed  - Basic Metabolic Panel (7) - Microalbumin / creatinine urine ratio  2. Hyperlipidemia, unspecified hyperlipidemia type Hyperlipidemia-importance of diet, weight control, activity, compliance with medications discussed.   Recent labs reviewed.   Any additional labs or refills ordered.   Importance of keeping under good control discussed. Regular follow-up visits discussed  - Lipid panel  3. Gastroesophageal reflux disease without esophagitis We will try to clarify with gastroenterology the proper instruction for his medicine  4. Hypocalcemia  Patient had recent lab work done calcium was slightly low we will recheck this - Vitamin D, 25-hydroxy  5. Hypokalemia Also potassium slightly low they gave him potassium supplement for 2 days he has been trying to eat enriched foods with potassium - Vitamin D, 25-hydroxy  6. High risk medication use Lab work ordered - Basic Metabolic Panel (7) - Microalbumin / creatinine urine ratio  7. Morbid obesity (HCC) Portion control regular physical activity Follow-up approximately 6 months wellness at that time

## 2022-11-10 ENCOUNTER — Telehealth: Payer: Self-pay | Admitting: Gastroenterology

## 2022-11-10 NOTE — Telephone Encounter (Signed)
Patient has not read previous MyChart message.  Please reach out to him to let him know that his Aciphex should be taken once daily 30 minutes prior to breakfast, not twice daily.  Given the high cost he will be discussing with his insurance to see what cheaper alternatives there are for him.  Please ask him if he wants to switch to something different. (Not pantoprazole).  Brooke Bonito, MSN, APRN, FNP-BC, AGACNP-BC [facility]

## 2022-11-10 NOTE — Telephone Encounter (Signed)
Spoke to pt, he informed me that he did pick up Aciphex and it is working well for him.

## 2022-11-11 ENCOUNTER — Encounter (HOSPITAL_COMMUNITY): Payer: Self-pay | Admitting: Internal Medicine

## 2022-11-15 ENCOUNTER — Encounter: Payer: Self-pay | Admitting: Family Medicine

## 2022-11-16 ENCOUNTER — Telehealth: Payer: Self-pay | Admitting: Family Medicine

## 2022-11-16 ENCOUNTER — Other Ambulatory Visit: Payer: Self-pay

## 2022-11-16 MED ORDER — PANTOPRAZOLE SODIUM 40 MG PO TBEC: 40.0000 mg | DELAYED_RELEASE_TABLET | Freq: Every day | ORAL | 1 refills | Status: DC

## 2022-11-16 NOTE — Telephone Encounter (Signed)
Patient is requesting prescription for pantoprazole 40 mg called into [facility]

## 2022-11-16 NOTE — Telephone Encounter (Signed)
Forwarded to provider.

## 2022-12-01 DIAGNOSIS — I10 Essential (primary) hypertension: Secondary | ICD-10-CM | POA: Diagnosis not present

## 2022-12-01 DIAGNOSIS — Z79899 Other long term (current) drug therapy: Secondary | ICD-10-CM | POA: Diagnosis not present

## 2022-12-01 DIAGNOSIS — E876 Hypokalemia: Secondary | ICD-10-CM | POA: Diagnosis not present

## 2022-12-01 DIAGNOSIS — E785 Hyperlipidemia, unspecified: Secondary | ICD-10-CM | POA: Diagnosis not present

## 2022-12-02 LAB — MICROALBUMIN / CREATININE URINE RATIO

## 2022-12-02 LAB — LIPID PANEL
Cholesterol, Total: 138 mg/dL (ref 100–199)
Triglycerides: 136 mg/dL (ref 0–149)
VLDL Cholesterol Cal: 24 mg/dL (ref 5–40)

## 2022-12-02 LAB — BASIC METABOLIC PANEL (7): CO2: 25 mmol/L (ref 20–29)

## 2022-12-02 LAB — VITAMIN D 25 HYDROXY (VIT D DEFICIENCY, FRACTURES): Vit D, 25-Hydroxy: 29.6 ng/mL — ABNORMAL LOW (ref 30.0–100.0)

## 2022-12-03 LAB — BASIC METABOLIC PANEL (7)
BUN/Creatinine Ratio: 12 (ref 10–24)
BUN: 13 mg/dL (ref 8–27)
Chloride: 102 mmol/L (ref 96–106)
Creatinine, Ser: 1.05 mg/dL (ref 0.76–1.27)
Glucose: 106 mg/dL — ABNORMAL HIGH (ref 70–99)
Potassium: 4.5 mmol/L (ref 3.5–5.2)
Sodium: 142 mmol/L (ref 134–144)
eGFR: 78 mL/min/{1.73_m2} (ref >59)

## 2022-12-03 LAB — LIPID PANEL
Chol/HDL Ratio: 3.6 ratio (ref 0.0–5.0)
HDL: 38 mg/dL — ABNORMAL LOW (ref >39)
LDL Chol Calc (NIH): 76 mg/dL (ref 0–99)

## 2022-12-27 NOTE — Progress Notes (Unsigned)
GI Office Note    Referring Provider: Babs Sciara, MD Primary Care Physician:  Babs Sciara, MD Primary Gastroenterologist: Gerrit Friends.Rourk, MD  Date:  12/28/2022  ID:  Andrew Manning, Andrew Manning 1955/10/19, MRN [id number]  Chief Complaint   Chief Complaint  Patient presents with   Follow-up    Follow up. No problems    History of Present Illness  Andrew Manning is a 67 y.o. male with a history of hypercholesteremia, HTN, GERD, and diarrhea presenting today for follow-up.  Last colonoscopy 08/03/2017: -Sigmoid and descending diverticulosis -Exam otherwise normal -Repeat in 10 years   No prior EGD on file.   Labs 05/07/2022: Lipid panel normal, slightly low HDL at 33.  LFTs within normal limits.  BMP normal other than elevated glucose.    Office visit 06/29/22.  Diarrhea becoming more prominent.  Will have nausea associated with his upset stomach as well.  Issues with fattier meals.  Gallbladder intact.  Appendectomy at age 89.  Working on losing weight and eating better.  Has tried Pepto with some mild relief of diarrhea.  Abdominal pain comes randomly and usually on the lower left side.  Stools usually are loose for few days then has improvement and then is back to looser stools.  Has chronic GERD.  Used to have severe chest pain.  Had 5 previous negative cardiac workup.  PPI for about 10 years, taking once daily.  Sour belch at night that is worsening.  Does have some dysphagia since neck surgery and issues with taking big vitamins.  Reportedly has had COVID 3 times.  Advise low FODMAP diet, Imodium as needed, Gas-X as needed.  Use dicyclomine twice daily as needed for diarrhea and abdominal cramping.  Continue PPI daily and famotidine nightly.  Plan to discuss EGD for Barrett's surveillance and dysphagia follow-up.  If ongoing issues we will complete celiac serologies, TSH, and CRP.  Last office visit 09/28/2022.  Felt as though oil from popcorn was upsetting his stomach.  Had not  had any loose stools within the last 2 weeks by avoiding popcorn.  Had only taking dicyclomine about 6 times which has been helpful.  GERD more controlled since being on Pepcid and felt as though he may have gotten used to pantoprazole.  Denied any nausea or vomiting.  Still occasionally having intermittent issues with dysphagia at site of prior surgery.  Advised to continue dicyclomine as needed, continue pantoprazole consider switching PPI pending EGD.  Continue Pepcid 20 mg once daily and scheduled for EGD with possible dilation.  EGD 11/04/2022: - Normal esophagus ( inlet patch) . Status post Maloney dilation.  -Gastric polyps status post biopsy removal  - Normal duodenal bulb -Advised to stop pantoprazole and start Aciphex 20 mg 30 minutes prior to breakfast.  In June he reported Aciphex was working well for him.  Today:  Dysphagia has resolved. Acid reflux is much more well controlled on Aciphex. Does not like the cost and wants to see if there is something cheaper.  Denies any nausea or vomiting.  Has started eating less and eating more fresher veggies his reflux and diarrhea seems to be much better. Stomach does make a little more noise recently.  Seems to feel better with eating yogurt.   Diarrhea seems sensitive to certain oils. Has tried popcorn again and had no issue. Has eaten some [place] food and had a little diarrhea from that. Sparingly uses bentyl. Still has half bottle of bentyl left.  No melena  or BRBPR.  Current Outpatient Medications  Medication Sig Dispense Refill   dicyclomine (BENTYL) 10 MG capsule Take 1 capsule (10 mg total) by mouth 2 (two) times daily as needed for spasms (diarrhea and abdominal cramping). 30 capsule 1   famotidine (PEPCID) 20 MG tablet Take 20 mg by mouth at bedtime.     hydrochlorothiazide (HYDRODIURIL) 25 MG tablet Take 1 tablet (25 mg total) by mouth daily. 90 tablet 3   loratadine (CLARITIN) 10 MG tablet Take 10 mg by mouth daily.     losartan  (COZAAR) 100 MG tablet TAKE 1 TABLET BY MOUTH ONCE DAILY. 90 tablet 1   meloxicam (MOBIC) 15 MG tablet Take 1 tablet (15 mg total) by mouth daily. 90 tablet 1   Multiple Vitamin (MULTIVITAMIN) tablet Take 1 tablet by mouth daily.     ondansetron (ZOFRAN) 4 MG tablet Take 1 tablet (4 mg total) by mouth every 8 (eight) hours as needed for nausea or vomiting. 20 tablet 0   potassium chloride SA (KLOR-CON M) 20 MEQ tablet Take 1 tablet (20 mEq total) by mouth daily for 2 days. (Patient not taking: Reported on 11/01/2022) 2 tablet 0   pravastatin (PRAVACHOL) 20 MG tablet Take 1 tablet (20 mg total) by mouth daily. 90 tablet 1   RABEprazole (ACIPHEX) 20 MG tablet Take 20 mg by mouth daily.     pantoprazole (PROTONIX) 40 MG tablet Take 1 tablet (40 mg total) by mouth daily. 90 tablet 1   No current facility-administered medications for this visit.    Past Medical History:  Diagnosis Date   Arthritis    Cellulitis    Complication of anesthesia    GERD (gastroesophageal reflux disease)    Hypercholesteremia    Hypertension    PONV (postoperative nausea and vomiting)    Seasonal allergies     Past Surgical History:  Procedure Laterality Date   CERVICAL FUSION     COLONOSCOPY N/A 08/03/2017   Procedure: COLONOSCOPY;  Surgeon: Corbin Ade, MD;  [facility];  Service: Endoscopy;  Laterality: N/A;  10:30   ESOPHAGOGASTRODUODENOSCOPY (EGD) WITH PROPOFOL N/A 11/04/2022   Procedure: ESOPHAGOGASTRODUODENOSCOPY (EGD) WITH PROPOFOL;  Surgeon: Corbin Ade, MD;  [facility];  Service: Endoscopy;  Laterality: N/A;  9:45am;ASA 2   KNEE ARTHROSCOPY     lipoma removal     from back   MALONEY DILATION N/A 11/04/2022   Procedure: MALONEY DILATION;  Surgeon: Corbin Ade, MD;  [facility];  Service: Endoscopy;  Laterality: N/A;  9:45am;asa 2   POLYPECTOMY  11/04/2022   Procedure: POLYPECTOMY;  Surgeon: Corbin Ade, MD;  [facility];  Service: Endoscopy;;    SHOULDER ARTHROSCOPY      Family History  Problem Relation Age of Onset   Cancer Mother        uterine   Cancer Father        lung cancer   Hypertension Father    Hypertension Sister    Hypertension Brother    Stroke Paternal Grandfather    Colon cancer Neg Hx     Allergies as of 12/28/2022   (No Known Allergies)    Social History   Socioeconomic History   Marital status: Married    Spouse name: Not on file   Number of children: Not on file   Years of education: Not on file   Highest education level: Not on file  Occupational History   Not on file  Tobacco Use   Smoking status: Never   Smokeless tobacco: Never  Vaping Use   Vaping status: Never Used  Substance and Sexual Activity   Alcohol use: No   Drug use: No   Sexual activity: Yes  Other Topics Concern   Not on file  Social History Narrative   Not on file   Social Determinants of Health   Financial Resource Strain: Not on file  Food Insecurity: Not on file  Transportation Needs: Not on file  Physical Activity: Insufficiently Active (11/08/2022)   Exercise Vital Sign    Days of Exercise per Week: 5 days    Minutes of Exercise per Session: 20 min  Stress: No Stress Concern Present (11/08/2022)   [employer] of Occupational Health - Occupational Stress Questionnaire    Feeling of Stress : Not at all  Social Connections: Not on file     Review of Systems   Gen: Denies fever, chills, anorexia. Denies fatigue, weakness, weight loss.  CV: Denies chest pain, palpitations, syncope, peripheral edema, and claudication. Resp: Denies dyspnea at rest, cough, wheezing, coughing up blood, and pleurisy. GI: See HPI Derm: Denies rash, itching, dry skin Psych: Denies depression, anxiety, memory loss, confusion. No homicidal or suicidal ideation.  Heme: Denies bruising, bleeding, and enlarged lymph nodes.   Physical Exam   BP 136/81 (BP Location: Right Arm, Patient Position: Sitting, Cuff Size: Normal)    Pulse 67   Temp 97.7 F (36.5 C) (Temporal)   Ht 5\' 9"  (1.753 m)   Wt 270 lb 6.4 oz (122.7 kg)   SpO2 98%   BMI 39.93 kg/m   General:   Alert and oriented. No distress noted. Pleasant and cooperative.  Head:  Normocephalic and atraumatic. Eyes:  Conjuctiva clear without scleral icterus. Mouth:  Oral mucosa pink and moist. Good dentition. No lesions. Abdomen:  +BS, soft, non-tender and non-distended. No rebound or guarding. No HSM or masses noted. Rectal: deferred Msk:  Symmetrical without gross deformities. Normal posture. Extremities:  Without edema. Neurologic:  Alert and  oriented x4 Psych:  Alert and cooperative. Normal mood and affect.   Assessment  Andrew Manning is a 67 y.o. male with a history of hypercholesteremia, HTN, GERD, and diarrhea presenting today for follow-up.  GERD, dysphagia: Symptoms have been well-controlled with Aciphex 20 mg once daily as well as Pepcid 10 mg nightly.  Would like to try different alternatives that could possibly be cheaper, reviewed his formulary list with him today and advised that we could try Nexium or lansoprazole which are also tier 2 medications for him but we can see if either of these are cheaper.  For now we will continue on the Aciphex at least until he gets back from his vacation in September before considering changing PPI.  Advised that given that Aciphex is working well I would recommend he stay on this for a while to see if this can calm down any gastritis and help with his reflux.  GERD diet reinforced.  Diarrhea, IBS: Has rare diarrhea now, likely due to dietary factors.  Has noticed some increased bowel sounds and some occasional lower abdominal discomfort but has rarely needed Bentyl.  Advised to stay away from dietary triggers.  PLAN   Continue Aciphex 20 mg once daily. Refilled today.  Continue Pepcid 10 mg nightly.  GERD diet Bentyl as needed Follow up 6 months    Brooke Bonito, MSN, FNP-BC,  AGACNP-BC [facility]

## 2022-12-28 ENCOUNTER — Encounter: Payer: Self-pay | Admitting: Gastroenterology

## 2022-12-28 ENCOUNTER — Ambulatory Visit: Payer: BC Managed Care – PPO | Admitting: Gastroenterology

## 2022-12-28 VITALS — BP 136/81 | HR 67 | Temp 97.7°F | Ht 69.0 in | Wt 270.4 lb

## 2022-12-28 DIAGNOSIS — K219 Gastro-esophageal reflux disease without esophagitis: Secondary | ICD-10-CM | POA: Diagnosis not present

## 2022-12-28 DIAGNOSIS — K589 Irritable bowel syndrome without diarrhea: Secondary | ICD-10-CM

## 2022-12-28 DIAGNOSIS — R195 Other fecal abnormalities: Secondary | ICD-10-CM

## 2022-12-28 DIAGNOSIS — R1310 Dysphagia, unspecified: Secondary | ICD-10-CM | POA: Diagnosis not present

## 2022-12-28 MED ORDER — RABEPRAZOLE SODIUM 20 MG PO TBEC: 20.0000 mg | DELAYED_RELEASE_TABLET | Freq: Every day | ORAL | 2 refills | Status: DC

## 2022-12-28 NOTE — Patient Instructions (Signed)
Continue Aciphex 20 mg once daily and Pepcid 10 mg nightly.  While you are on your trip if you have any worsening symptoms given exposure to new foods then you may increase your Pepcid to 20 or 30 mg daily for breakthrough.  Please reach out to your insurance to see if Nexium or lansoprazole would be covered more by your insurance and be less of the co-pay and we can consider switching after September after you return from vacation.  Follow a GERD diet:  Avoid fried, fatty, greasy, spicy, citrus foods. Avoid caffeine and carbonated beverages. Avoid chocolate. Try eating 4-6 small meals a day rather than 3 large meals. Do not eat within 3 hours of laying down. Prop head of bed up on wood or bricks to create a 6 inch incline.   Continue Bentyl as needed for lower abdominal cramping and diarrhea.  Follow-up in 6 months, sooner if needed.  It was a pleasure to see you today. I want to create trusting relationships with patients. If you receive a survey regarding your visit,  I greatly appreciate you taking time to fill this out on paper or through your MyChart. I value your feedback.  Brooke Bonito, MSN, FNP-BC, AGACNP-BC [facility]

## 2023-01-04 ENCOUNTER — Other Ambulatory Visit: Payer: Self-pay | Admitting: Gastroenterology

## 2023-01-04 DIAGNOSIS — K219 Gastro-esophageal reflux disease without esophagitis: Secondary | ICD-10-CM

## 2023-01-04 MED ORDER — ESOMEPRAZOLE MAGNESIUM 40 MG PO CPDR
40.0000 mg | DELAYED_RELEASE_CAPSULE | Freq: Every day | ORAL | 1 refills | Status: DC
Start: 2023-01-04 — End: 2023-05-31

## 2023-02-03 ENCOUNTER — Other Ambulatory Visit: Payer: Self-pay | Admitting: *Deleted

## 2023-02-03 MED ORDER — PRAVASTATIN SODIUM 20 MG PO TABS: 20.0000 mg | ORAL_TABLET | Freq: Every day | ORAL | 0 refills | Status: DC

## 2023-02-03 MED ORDER — MELOXICAM 15 MG PO TABS: 15.0000 mg | ORAL_TABLET | Freq: Every day | ORAL | 0 refills | Status: DC

## 2023-05-08 ENCOUNTER — Other Ambulatory Visit: Payer: Self-pay | Admitting: Family Medicine

## 2023-05-23 DIAGNOSIS — H6121 Impacted cerumen, right ear: Secondary | ICD-10-CM | POA: Diagnosis not present

## 2023-05-23 DIAGNOSIS — H903 Sensorineural hearing loss, bilateral: Secondary | ICD-10-CM | POA: Diagnosis not present

## 2023-05-23 DIAGNOSIS — H9313 Tinnitus, bilateral: Secondary | ICD-10-CM | POA: Diagnosis not present

## 2023-05-31 ENCOUNTER — Ambulatory Visit (INDEPENDENT_AMBULATORY_CARE_PROVIDER_SITE_OTHER): Payer: BC Managed Care – PPO | Admitting: Family Medicine

## 2023-05-31 VITALS — BP 134/78 | HR 66 | Temp 98.4°F | Ht 69.0 in | Wt 274.6 lb

## 2023-05-31 DIAGNOSIS — I10 Essential (primary) hypertension: Secondary | ICD-10-CM | POA: Diagnosis not present

## 2023-05-31 DIAGNOSIS — Z125 Encounter for screening for malignant neoplasm of prostate: Secondary | ICD-10-CM

## 2023-05-31 DIAGNOSIS — Z0000 Encounter for general adult medical examination without abnormal findings: Secondary | ICD-10-CM

## 2023-05-31 DIAGNOSIS — Z0001 Encounter for general adult medical examination with abnormal findings: Secondary | ICD-10-CM

## 2023-05-31 DIAGNOSIS — Z23 Encounter for immunization: Secondary | ICD-10-CM

## 2023-05-31 DIAGNOSIS — E785 Hyperlipidemia, unspecified: Secondary | ICD-10-CM

## 2023-05-31 DIAGNOSIS — Z79899 Other long term (current) drug therapy: Secondary | ICD-10-CM

## 2023-05-31 MED ORDER — MELOXICAM 15 MG PO TABS: 15.0000 mg | ORAL_TABLET | Freq: Every day | ORAL | 1 refills | Status: DC

## 2023-05-31 MED ORDER — PRAVASTATIN SODIUM 20 MG PO TABS: 20.0000 mg | ORAL_TABLET | Freq: Every day | ORAL | 1 refills | Status: DC

## 2023-05-31 MED ORDER — LOSARTAN POTASSIUM 100 MG PO TABS: ORAL_TABLET | ORAL | 1 refills | Status: DC

## 2023-05-31 NOTE — Progress Notes (Signed)
 Subjective:    Patient ID: Andrew Manning, male    DOB: 26-Apr-1956, 67 y.o.   MRN: [id number]  HPI The patient comes in today for a wellness visit.    A review of their health history was completed.  A review of medications was also completed.  Any needed refills; All of them  Eating habits: Normal Discussed the use of AI scribe software for clinical note transcription with the patient, who gave verbal consent to proceed.  History of Present Illness   The patient, a 67 year old with a history of joint pain managed with Meloxicam , presents with worsening tinnitus and high-frequency hearing loss. The patient reports that the tinnitus has become so severe that it often drowns out conversations. The patient has been evaluated by an audiologist and has been deemed a candidate for hearing aids. The patient has a history of exposure to loud noises at work and during home projects, and has been using ear protection for the past five years.  The patient also reports occasional difficulty breathing due to allergies, particularly during the warmer months. Despite this, the patient's overall health is reported as good, with no other significant concerns.  The patient has been experiencing stomach issues, including diarrhea and vomiting, which he attributes to acid reflux. The patient was previously on Pantoprazole , but this was switched to Rabeprazole  by a gastroenterologist, which has reportedly improved the symptoms significantly.  The patient maintains a relatively healthy diet, with a recent increase in fruit and vegetable intake. However, the patient admits to drinking more soft drinks than he should. The patient is also consistent with his medication regimen, which he manages using a pill box.  The patient has a history of diverticula, but reports regular bowel movements without the need for stool softeners. The patient's last colonoscopy in 2019 showed no significant issues. The patient is  a non-smoker and reports minimal alcohol consumption.  The patient is due for a flu shot and has been considering the Shingrix vaccine for shingles prevention. The patient is also due for a tetanus shot, which he plans to get at a pharmacy.  The patient is nearing retirement, with plans to retire in a year. The patient reports being safety-conscious both at work and at home, and is generally in good health.      Falls/  MVA accidents in past few months: No  Regular exercise: Yes  Specialist pt sees on regular basis: Gasto, ENT  Preventative health issues were discussed.   Additional concerns: Broken blood vessel in Right eye   Review of Systems     Objective:   Physical Exam  General-in no acute distress Eyes-no discharge Lungs-respiratory rate normal, CTA CV-no murmurs,RRR Extremities skin warm dry no edema Neuro grossly normal Behavior normal, alert After shared discussion prostate deferred      Assessment & Plan:  Assessment and Plan    Sensorineural High Frequency Hearing Loss Progressive tinnitus and difficulty hearing conversations. Audiometry confirmed the diagnosis and patient is a candidate for hearing aids. -Consultation for hearing aids scheduled on 21st.  Gastroesophageal Reflux Disease Previously had issues with stomach upset and vomiting, improved with switch to Rabeprazole . -Continue Rabeprazole .  Joint Pain Chronic use of Meloxicam , discussed potential risks including stomach issues and kidney stress. Patient reports knee pain if medication is stopped for a couple of weeks. -Taper Meloxicam  use, starting with every other day dosing. Patient to provide update in one month.  General Health Maintenance -Administer influenza vaccine today. -Discussed Shingrix vaccine  for shingles prevention, patient to consider. -Discussed Tdap vaccine, patient to consider at pharmacy. -Order cholesterol, kidney function, and PSA blood tests to be completed in the  next couple of weeks. -Prostate exam declined by patient, to be reconsidered if PSA results are abnormal.      1. Well adult exam (Primary) Adult wellness-complete.wellness physical was conducted today. Importance of diet and exercise were discussed in detail.  Importance of stress reduction and healthy living were discussed.  In addition to this a discussion regarding safety was also covered.  We also reviewed over immunizations and gave recommendations regarding current immunization needed for age.   In addition to this additional areas were also touched on including: Preventative health exams needed:  Colonoscopy 2029  Patient was advised yearly wellness exam   2. HTN (hypertension), benign HTN- patient seen for follow-up regarding HTN.   Diet, medication compliance, appropriate labs and refills were completed.   Importance of keeping blood pressure under good control to lessen the risk of complications discussed Regular follow-up visits discussed  - Basic Metabolic Panel  3. Hyperlipidemia, unspecified hyperlipidemia type Hyperlipidemia-importance of diet, weight control, activity, compliance with medications discussed.   Recent labs reviewed.   Any additional labs or refills ordered.   Importance of keeping under good control discussed. Regular follow-up visits discussed  - Lipid Panel  4. Prostate cancer screening Screening PSA prostate exam deferred after shared discussion - PSA  5. Immunization due Flu shot today - Flu Vaccine Trivalent High Dose (Fluad)  6. High risk medication use Lab work ordered - Hepatic Function Panel

## 2023-05-31 NOTE — Progress Notes (Signed)
 GI Office Note    Referring Provider: Alphonsa Glendia LABOR, MD Primary Care Physician:  Alphonsa Glendia LABOR, MD Primary Gastroenterologist: Lamar HERO.Rourk, MD  Date:  06/02/2023  ID:  Alm LELON Bologna, DOB 12-12-1955, MRN [id number]   Chief Complaint   Chief Complaint  Patient presents with   Follow-up    Follow up on GERD, pt states medication he was put on didn't work so he went back to Raberprazole   History of Present Illness  LORANCE PICKERAL is a 67 y.o. male with a history of GERD, IBS-C, HTN, and HLD presenting today for follow-up of chronic GI issues.  Last colonoscopy 08/03/2017: -Sigmoid and descending diverticulosis -Exam otherwise normal -Repeat in 10 years   OV 06/29/22.  Diarrhea becoming more prominent.  Will have nausea associated with his upset stomach as well.  Issues with fattier meals.  Gallbladder intact.  Appendectomy at age 73.  Working on losing weight and eating better.  Has tried Pepto with some mild relief of diarrhea.  Abdominal pain comes randomly and usually on the lower left side.  Stools usually are loose for few days then has improvement and then is back to looser stools.  Has chronic GERD.  Used to have severe chest pain.  Had 5 previous negative cardiac workup.  PPI for about 10 years, taking once daily.  Sour belch at night that is worsening.  Does have some dysphagia since neck surgery and issues with taking big vitamins.  Reportedly has had COVID 3 times.  Advise low FODMAP diet, Imodium as needed, Gas-X as needed.  Use dicyclomine  twice daily as needed for diarrhea and abdominal cramping.  Continue PPI daily and famotidine nightly.  Plan to discuss EGD for Barrett's surveillance and dysphagia follow-up.  If ongoing issues we will complete celiac serologies, TSH, and CRP.   OV 09/28/2022.  Felt as though oil from popcorn was upsetting his stomach.  Had not had any loose stools within the last 2 weeks by avoiding popcorn.  Had only taking dicyclomine  about 6  times which has been helpful.  GERD more controlled since being on Pepcid and felt as though he may have gotten used to pantoprazole .  Denied any nausea or vomiting.  Still occasionally having intermittent issues with dysphagia at site of prior surgery.  Advised to continue dicyclomine  as needed, continue pantoprazole  consider switching PPI pending EGD.  Continue Pepcid 20 mg once daily and scheduled for EGD with possible dilation.   EGD 11/04/2022: - Normal esophagus ( inlet patch) . Status post Maloney dilation.  - Gastric polyps status post biopsy removal  - Normal duodenal bulb - Advised to stop pantoprazole  and start Aciphex  20 mg 30 minutes prior to breakfast.   In June he reported Aciphex  was working well for him.  Last office visit 12/28/22.  Dysphagia resolved.  Felt as though his acid reflux was much more well-controlled on Aciphex  but does not like the cost and like to see if there is anything cheaper.  Denies any nausea or vomiting.  Had been eating smaller meals and eating fresh vegetables and had noticed since doing this that his diarrhea and reflux have been much improved.  Also eating yogurt which seems to help.  He feels as though his diarrhea is related to eating certain oils and was excited that he could go back to eating some popcorn without cause of diarrhea.  Had only been using Bentyl  sparingly.  Aciphex  was refilled at this office visit and he was  to contact insurance to see what would be cheaper and then we can send in either Nexium  or lansoprazole based off his formulary.  Nexium  ultimately sent in for patient.  Today:  Has been without for 2 days. Does not feel like he took the Nexium . Can get the Aciphex  cheaper from [facility] due to availability. GoodRx allowed him to get the medication cheaper at [facility]. He states this works the best for him out of all the other he has tried. Denies N/V, dysphagia. Has good appetite.  Denies any abdominal pain or early satiety.  He has  been talking about cutting back on his Meloxicam . He is going to try every other day  to see if he can reduce how much he takes  to see if he can use it as needed.  Denies melena or BRBPR.  No issues with diarrhea. He keeps the dicyclomine  on hand but has only had it a few times when he traveled not knowing where bathrooms were and with eating different foods.   Wt Readings from Last 3 Encounters:  06/02/23 276 lb 6.4 oz (125.4 kg)  05/31/23 274 lb 9.6 oz (124.6 kg)  12/28/22 270 lb 6.4 oz (122.7 kg)   Current Outpatient Medications  Medication Sig Dispense Refill   dicyclomine  (BENTYL ) 10 MG capsule Take 1 capsule (10 mg total) by mouth 2 (two) times daily as needed for spasms (diarrhea and abdominal cramping). 30 capsule 1   famotidine (PEPCID) 20 MG tablet Take 20 mg by mouth at bedtime.     hydrochlorothiazide  (HYDRODIURIL ) 25 MG tablet Take 1 tablet (25 mg total) by mouth daily. 90 tablet 3   loratadine (CLARITIN) 10 MG tablet Take 10 mg by mouth daily.     losartan  (COZAAR ) 100 MG tablet TAKE 1 TABLET BY MOUTH ONCE DAILY. 90 tablet 1   meloxicam  (MOBIC ) 15 MG tablet Take 1 tablet (15 mg total) by mouth daily. 90 tablet 1   Multiple Vitamin (MULTIVITAMIN) tablet Take 1 tablet by mouth daily.     ondansetron  (ZOFRAN ) 4 MG tablet Take 1 tablet (4 mg total) by mouth every 8 (eight) hours as needed for nausea or vomiting. 20 tablet 0   pravastatin  (PRAVACHOL ) 20 MG tablet Take 1 tablet (20 mg total) by mouth daily. 90 tablet 1   RABEprazole  (ACIPHEX ) 20 MG tablet Take 20 mg by mouth daily.     No current facility-administered medications for this visit.    Past Medical History:  Diagnosis Date   Arthritis    Cellulitis    Complication of anesthesia    GERD (gastroesophageal reflux disease)    Hypercholesteremia    Hypertension    PONV (postoperative nausea and vomiting)    Seasonal allergies     Past Surgical History:  Procedure Laterality Date   CERVICAL FUSION      COLONOSCOPY N/A 08/03/2017   Procedure: COLONOSCOPY;  Surgeon: Shaaron Lamar HERO, MD;  Location: [facility];  Service: Endoscopy;  Laterality: N/A;  10:30   ESOPHAGOGASTRODUODENOSCOPY (EGD) WITH PROPOFOL  N/A 11/04/2022   Procedure: ESOPHAGOGASTRODUODENOSCOPY (EGD) WITH PROPOFOL ;  Surgeon: Shaaron Lamar HERO, MD;  Location: [facility];  Service: Endoscopy;  Laterality: N/A;  9:45am;ASA 2   KNEE ARTHROSCOPY     lipoma removal     from back   MALONEY DILATION N/A 11/04/2022   Procedure: MALONEY DILATION;  Surgeon: Shaaron Lamar HERO, MD;  Location: [facility];  Service: Endoscopy;  Laterality: N/A;  9:45am;asa 2   POLYPECTOMY  11/04/2022   Procedure: POLYPECTOMY;  Surgeon: Shaaron Lamar HERO, MD;  Location: [facility];  Service: Endoscopy;;   SHOULDER ARTHROSCOPY      Family History  Problem Relation Age of Onset   Cancer Mother        uterine   Cancer Father        lung cancer   Hypertension Father    Hypertension Sister    Hypertension Brother    Stroke Paternal Grandfather    Colon cancer Neg Hx     Allergies as of 06/02/2023   (No Known Allergies)    Social History   Socioeconomic History   Marital status: Married    Spouse name: Not on file   Number of children: Not on file   Years of education: Not on file   Highest education level: Not on file  Occupational History   Not on file  Tobacco Use   Smoking status: Never   Smokeless tobacco: Never  Vaping Use   Vaping status: Never Used  Substance and Sexual Activity   Alcohol use: No   Drug use: No   Sexual activity: Yes  Other Topics Concern   Not on file  Social History Narrative   Not on file   Social Drivers of Health   Financial Resource Strain: Not on file  Food Insecurity: Low Risk  (05/23/2023)   Received from [facility]   Hunger Vital Sign    Worried About Running Out of Food in the Last Year: Never true    Ran Out of Food in the Last Year: Never true  Transportation Needs: No Transportation Needs  (05/23/2023)   Received from [facility]    In the past 12 months, has lack of reliable transportation kept you from medical appointments, meetings, work or from getting things needed for daily living? : No  Physical Activity: Insufficiently Active (11/08/2022)   Exercise Vital Sign    Days of Exercise per Week: 5 days    Minutes of Exercise per Session: 20 min  Stress: No Stress Concern Present (11/08/2022)   [employer] of Occupational [employer] - Occupational Stress Questionnaire    Feeling of Stress : Not at all  Social Connections: Not on file     Review of Systems   Gen: Denies fever, chills, anorexia. Denies fatigue, weakness, weight loss.  CV: Denies chest pain, palpitations, syncope, peripheral edema, and claudication. Resp: Denies dyspnea at rest, cough, wheezing, coughing up blood, and pleurisy. GI: See HPI Derm: Denies rash, itching, dry skin Psych: Denies depression, anxiety, memory loss, confusion. No homicidal or suicidal ideation.  Heme: Denies bruising, bleeding, and enlarged lymph nodes.  Physical Exam   BP 131/81   Pulse 71   Temp 98 F (36.7 C)   Ht 5' 9 (1.753 m)   Wt 276 lb 6.4 oz (125.4 kg)   BMI 40.82 kg/m   General:   Alert and oriented. No distress noted. Pleasant and cooperative.  Head:  Normocephalic and atraumatic. Eyes:  Conjuctiva clear without scleral icterus. Mouth:  Oral mucosa pink and moist. Good dentition. No lesions. Abdomen:  +BS, soft, non-tender and non-distended. No rebound or guarding. No HSM or masses noted. Rectal: deferred Msk:  Symmetrical without gross deformities. Normal posture. Extremities:  Without edema. Neurologic:  Alert and  oriented x4 Psych:  Alert and cooperative. Normal mood and affect.  Assessment  CHIBUIKE FLEEK is a 67 y.o. male with a history of GERD, IBS-C, HTN,  and HLD presenting today for follow-up of chronic GI issues.  GERD, dysphagia: Symptoms well-controlled on rabeprazole   20 mg once daily and famotidine 10-20 mg nightly as needed.  Does not feel as though he tried the Nexium  we discussed at last visit.  Has failed omeprazole and pantoprazole  in the past.  Dysphagia resolved on rabeprazole .  IBS, diarrhea: Not currently having any issues.  Denies any abdominal pain.  Had some mild stomach upset when he traveled to [place] and he used dicyclomine  as needed but otherwise has not needed any of this but would like to continue to keep it on hand.  PLAN   Continue rabeprazole  20 mg once daily Famotidine 10-20 mg nightly as needed GERD diet Dicyclomine  as needed Follow-up in 1 year, sooner if needed.   Charmaine Melia, MSN, FNP-BC, AGACNP-BC [facility]

## 2023-06-01 LAB — BASIC METABOLIC PANEL
BUN/Creatinine Ratio: 15 (ref 10–24)
BUN: 16 mg/dL (ref 8–27)
CO2: 26 mmol/L (ref 20–29)
Calcium: 9.3 mg/dL (ref 8.6–10.2)
Chloride: 97 mmol/L (ref 96–106)
Creatinine, Ser: 1.06 mg/dL (ref 0.76–1.27)
Glucose: 108 mg/dL — ABNORMAL HIGH (ref 70–99)
Potassium: 4.2 mmol/L (ref 3.5–5.2)
Sodium: 140 mmol/L (ref 134–144)
eGFR: 77 mL/min/{1.73_m2} (ref >59)

## 2023-06-01 LAB — PSA: Prostate Specific Ag, Serum: 0.5 ng/mL (ref 0.0–4.0)

## 2023-06-01 LAB — LIPID PANEL
Chol/HDL Ratio: 3.1 {ratio} (ref 0.0–5.0)
Cholesterol, Total: 148 mg/dL (ref 100–199)
HDL: 47 mg/dL (ref >39)
LDL Chol Calc (NIH): 86 mg/dL (ref 0–99)
Triglycerides: 77 mg/dL (ref 0–149)
VLDL Cholesterol Cal: 15 mg/dL (ref 5–40)

## 2023-06-01 LAB — HEPATIC FUNCTION PANEL
ALT: 25 [IU]/L (ref 0–44)
AST: 21 [IU]/L (ref 0–40)
Albumin: 4.4 g/dL (ref 3.9–4.9)
Alkaline Phosphatase: 94 [IU]/L (ref 44–121)
Bilirubin Total: 0.4 mg/dL (ref 0.0–1.2)
Bilirubin, Direct: 0.14 mg/dL (ref 0.00–0.40)
Total Protein: 6.8 g/dL (ref 6.0–8.5)

## 2023-06-02 ENCOUNTER — Encounter: Payer: Self-pay | Admitting: Gastroenterology

## 2023-06-02 ENCOUNTER — Ambulatory Visit: Payer: BC Managed Care – PPO | Admitting: Gastroenterology

## 2023-06-02 VITALS — BP 131/81 | HR 71 | Temp 98.0°F | Ht 69.0 in | Wt 276.4 lb

## 2023-06-02 DIAGNOSIS — K589 Irritable bowel syndrome without diarrhea: Secondary | ICD-10-CM

## 2023-06-02 DIAGNOSIS — R1310 Dysphagia, unspecified: Secondary | ICD-10-CM

## 2023-06-02 DIAGNOSIS — K219 Gastro-esophageal reflux disease without esophagitis: Secondary | ICD-10-CM

## 2023-06-02 MED ORDER — RABEPRAZOLE SODIUM 20 MG PO TBEC: 20.0000 mg | DELAYED_RELEASE_TABLET | Freq: Every day | ORAL | 3 refills | Status: DC

## 2023-06-02 NOTE — Patient Instructions (Addendum)
 Continue rabeprazole  20 mg once daily and famotidine (pepcid) as needed for breakthrough. I sent your rabeprazole  prescription to [facility] for you but if you would like to check with [facility] or [facility] to see if a 90-day supply would be cheaper there with GoodRx then please let me know.  Follow a GERD diet:  Avoid fried, fatty, greasy, spicy, citrus foods. Avoid caffeine and carbonated beverages. Avoid chocolate. Try eating 4-6 small meals a day rather than 3 large meals. Do not eat within 3 hours of laying down. Prop head of bed up on wood or bricks to create a 6 inch incline.  Continue with your plan to slowly reduce your use of meloxicam .  Continue the dicyclomine  as needed.  If your prescription runs out before we have your next follow-up just let me know and I will be happy to send in another short refill for you.  Happy new year!  It was a pleasure to see you today. I want to create trusting relationships with patients. If you receive a survey regarding your visit,  I greatly appreciate you taking time to fill this out on paper or through your MyChart. I value your feedback.  Charmaine Melia, MSN, FNP-BC, AGACNP-BC [facility]

## 2023-06-03 ENCOUNTER — Other Ambulatory Visit: Payer: Self-pay

## 2023-06-03 ENCOUNTER — Other Ambulatory Visit: Payer: Self-pay | Admitting: Gastroenterology

## 2023-06-03 DIAGNOSIS — Z79899 Other long term (current) drug therapy: Secondary | ICD-10-CM

## 2023-06-03 DIAGNOSIS — E785 Hyperlipidemia, unspecified: Secondary | ICD-10-CM

## 2023-06-03 MED ORDER — RABEPRAZOLE SODIUM 20 MG PO TBEC: 20.0000 mg | DELAYED_RELEASE_TABLET | Freq: Every day | ORAL | 3 refills | Status: DC

## 2023-06-03 MED ORDER — PRAVASTATIN SODIUM 40 MG PO TABS: 40.0000 mg | ORAL_TABLET | Freq: Every day | ORAL | 1 refills | Status: DC

## 2023-07-05 DIAGNOSIS — E785 Hyperlipidemia, unspecified: Secondary | ICD-10-CM | POA: Diagnosis not present

## 2023-07-05 DIAGNOSIS — Z79899 Other long term (current) drug therapy: Secondary | ICD-10-CM | POA: Diagnosis not present

## 2023-07-06 ENCOUNTER — Encounter: Payer: Self-pay | Admitting: Family Medicine

## 2023-07-06 LAB — HEPATIC FUNCTION PANEL
ALT: 16 [IU]/L (ref 0–44)
AST: 18 [IU]/L (ref 0–40)
Albumin: 4.1 g/dL (ref 3.9–4.9)
Alkaline Phosphatase: 88 [IU]/L (ref 44–121)
Bilirubin Total: 0.5 mg/dL (ref 0.0–1.2)
Bilirubin, Direct: 0.19 mg/dL (ref 0.00–0.40)
Total Protein: 6.5 g/dL (ref 6.0–8.5)

## 2023-07-06 LAB — LIPID PANEL
Chol/HDL Ratio: 3.5 {ratio} (ref 0.0–5.0)
Cholesterol, Total: 121 mg/dL (ref 100–199)
HDL: 35 mg/dL — ABNORMAL LOW (ref >39)
LDL Chol Calc (NIH): 69 mg/dL (ref 0–99)
Triglycerides: 87 mg/dL (ref 0–149)
VLDL Cholesterol Cal: 17 mg/dL (ref 5–40)

## 2023-07-11 DIAGNOSIS — H903 Sensorineural hearing loss, bilateral: Secondary | ICD-10-CM | POA: Diagnosis not present

## 2023-08-26 DIAGNOSIS — H40053 Ocular hypertension, bilateral: Secondary | ICD-10-CM | POA: Diagnosis not present

## 2023-11-29 ENCOUNTER — Ambulatory Visit: Payer: BC Managed Care – PPO | Admitting: Family Medicine

## 2023-11-29 ENCOUNTER — Encounter: Payer: Self-pay | Admitting: Family Medicine

## 2023-11-29 DIAGNOSIS — I10 Essential (primary) hypertension: Secondary | ICD-10-CM

## 2023-11-29 DIAGNOSIS — E785 Hyperlipidemia, unspecified: Secondary | ICD-10-CM | POA: Diagnosis not present

## 2023-11-29 DIAGNOSIS — Z79899 Other long term (current) drug therapy: Secondary | ICD-10-CM | POA: Diagnosis not present

## 2023-11-29 DIAGNOSIS — K219 Gastro-esophageal reflux disease without esophagitis: Secondary | ICD-10-CM

## 2023-11-29 MED ORDER — MELOXICAM 15 MG PO TABS: 15.0000 mg | ORAL_TABLET | Freq: Every day | ORAL | 1 refills | Status: DC

## 2023-11-29 MED ORDER — LOSARTAN POTASSIUM 100 MG PO TABS: ORAL_TABLET | ORAL | 1 refills | Status: DC

## 2023-11-29 MED ORDER — HYDROCHLOROTHIAZIDE 25 MG PO TABS: 25.0000 mg | ORAL_TABLET | Freq: Every day | ORAL | 3 refills | Status: DC

## 2023-11-29 MED ORDER — RABEPRAZOLE SODIUM 20 MG PO TBEC: 20.0000 mg | DELAYED_RELEASE_TABLET | Freq: Every day | ORAL | 3 refills | Status: DC

## 2023-11-29 MED ORDER — PRAVASTATIN SODIUM 40 MG PO TABS: 40.0000 mg | ORAL_TABLET | Freq: Every day | ORAL | 1 refills | Status: DC

## 2023-11-29 NOTE — Progress Notes (Signed)
   Subjective:    Patient ID: Andrew Manning, male    DOB: [birth year], 68 y.o.   MRN: [id number]  HPI  6 month follow up refills for everything Discussed the use of AI scribe software for clinical note transcription with the patient, who gave verbal consent to proceed.  History of Present Illness   Andrew Manning is a 68 year old male who presents for a routine follow-up visit.  He experiences dyspnea after walking for about twenty minutes, particularly on inclines. His head often feels 'stopped up,' exacerbating breathing difficulties, especially in high humidity. No chest pains are noted.  He typically gets more than six hours of sleep per night but wakes up to urinate, which disrupts his sleep. Tinnitus makes it difficult to fall back asleep after waking. He does not use white noise but sleeps with the TV on a timer.  He takes several medications: an acid blocker, pravastatin  for cholesterol, losartan  and HCTZ for blood pressure, and meloxicam  for knee pain. He has reduced meloxicam  to four days a week, taking it on days when he is more active. His cholesterol medication was increased from 20 to 40 mg, which improved his LDL levels.     Will do some walking to try to help with his health He does relate respiratory illness and he tends to tire out when he is walking up a slope and does not have any chest pain with it Patient for blood pressure check up.  The patient does have hypertension.   Patient relates dietary measures try to minimize salt The importance of healthy diet and activity were discussed Patient relates compliance  Patient here for follow-up regarding cholesterol.    Patient relates taking medication on a regular basis Denies problems with medication Importance of dietary measures discussed Regular lab work regarding lipid and liver was checked and if needing additional labs was appropriately ordered Patient does have morbid obesity working on portion  control  Review of Systems     Objective:   Physical Exam General-in no acute distress Eyes-no discharge Lungs-respiratory rate normal, CTA CV-no murmurs,RRR Extremities skin warm dry no edema Neuro grossly normal Behavior normal, alert        Assessment & Plan:   Assessment and Plan    Tinnitus Chronic tinnitus causing sleep disturbances. - Consider white noise at night to improve sleep quality.  Osteoarthritis Knee pain managed with meloxicam , balancing pain relief and side effects. - Continue meloxicam  four days a week as needed. - Monitor for gastrointestinal or renal side effects.  Hypertension Blood pressure well-controlled with losartan  and HCTZ. - Continue current antihypertensive regimen.  Hyperlipidemia Cholesterol levels well-managed with pravastatin  40 mg. - Continue pravastatin  40 mg daily. - Plan comprehensive blood work in December.  General Health Maintenance Discussed shingles vaccine benefits and side effects. - Consider shingles vaccination; discuss with pharmacy for scheduling and insurance coverage.     1. Morbid obesity (HCC) (Primary) Portion control regular physical activity  2. HTN (hypertension), benign Blood pressure good control continue current measures  3. Hyperlipidemia, unspecified hyperlipidemia type Recent cholesterol profile from February looked good continue current measures lab work later this year  4. High risk medication use Tolerating pravastatin  well  5. Gastroesophageal reflux disease without esophagitis On Aciphex  watch his diet  Follow-up 6 months comprehensive lab work at that visit

## 2023-11-29 NOTE — Patient Instructions (Signed)

## 2024-03-07 ENCOUNTER — Other Ambulatory Visit: Payer: Self-pay | Admitting: Gastroenterology

## 2024-05-03 ENCOUNTER — Encounter: Payer: Self-pay | Admitting: Gastroenterology

## 2024-06-03 ENCOUNTER — Encounter: Payer: Self-pay | Admitting: Family Medicine

## 2024-06-03 ENCOUNTER — Other Ambulatory Visit: Payer: Self-pay | Admitting: Family Medicine

## 2024-06-03 DIAGNOSIS — Z79899 Other long term (current) drug therapy: Secondary | ICD-10-CM

## 2024-06-03 DIAGNOSIS — I10 Essential (primary) hypertension: Secondary | ICD-10-CM

## 2024-06-03 DIAGNOSIS — E785 Hyperlipidemia, unspecified: Secondary | ICD-10-CM

## 2024-06-03 DIAGNOSIS — R739 Hyperglycemia, unspecified: Secondary | ICD-10-CM

## 2024-06-03 DIAGNOSIS — Z125 Encounter for screening for malignant neoplasm of prostate: Secondary | ICD-10-CM

## 2024-06-05 LAB — BASIC METABOLIC PANEL WITH GFR
BUN/Creatinine Ratio: 12 (ref 10–24)
BUN: 12 mg/dL (ref 8–27)
CO2: 26 mmol/L (ref 20–29)
Calcium: 9.4 mg/dL (ref 8.6–10.2)
Chloride: 100 mmol/L (ref 96–106)
Creatinine, Ser: 1 mg/dL (ref 0.76–1.27)
Glucose: 107 mg/dL — ABNORMAL HIGH (ref 70–99)
Potassium: 3.7 mmol/L (ref 3.5–5.2)
Sodium: 141 mmol/L (ref 134–144)
eGFR: 82 mL/min/1.73

## 2024-06-05 LAB — HEPATIC FUNCTION PANEL
ALT: 21 IU/L (ref 0–44)
AST: 21 IU/L (ref 0–40)
Albumin: 4.1 g/dL (ref 3.9–4.9)
Alkaline Phosphatase: 80 IU/L (ref 47–123)
Bilirubin Total: 0.5 mg/dL (ref 0.0–1.2)
Bilirubin, Direct: 0.18 mg/dL (ref 0.00–0.40)
Total Protein: 6.6 g/dL (ref 6.0–8.5)

## 2024-06-05 LAB — LIPID PANEL
Chol/HDL Ratio: 3.8 ratio (ref 0.0–5.0)
Cholesterol, Total: 126 mg/dL (ref 100–199)
HDL: 33 mg/dL — ABNORMAL LOW
LDL Chol Calc (NIH): 70 mg/dL (ref 0–99)
Triglycerides: 128 mg/dL (ref 0–149)
VLDL Cholesterol Cal: 23 mg/dL (ref 5–40)

## 2024-06-05 LAB — HEMOGLOBIN A1C
Est. average glucose Bld gHb Est-mCnc: 111 mg/dL
Hgb A1c MFr Bld: 5.5 % (ref 4.8–5.6)

## 2024-06-05 LAB — PSA: Prostate Specific Ag, Serum: 0.4 ng/mL (ref 0.0–4.0)

## 2024-06-05 LAB — MICROALBUMIN / CREATININE URINE RATIO
Creatinine, Urine: 126 mg/dL
Microalb/Creat Ratio: 4 mg/g{creat} (ref 0–29)
Microalbumin, Urine: 5.1 ug/mL

## 2024-06-06 ENCOUNTER — Ambulatory Visit: Admitting: Family Medicine

## 2024-06-06 ENCOUNTER — Ambulatory Visit: Payer: Self-pay | Admitting: Family Medicine

## 2024-06-06 VITALS — BP 124/64 | HR 69 | Temp 98.4°F | Ht 69.0 in | Wt 268.2 lb

## 2024-06-06 DIAGNOSIS — Z79899 Other long term (current) drug therapy: Secondary | ICD-10-CM

## 2024-06-06 DIAGNOSIS — M25511 Pain in right shoulder: Secondary | ICD-10-CM | POA: Diagnosis not present

## 2024-06-06 DIAGNOSIS — Z0001 Encounter for general adult medical examination with abnormal findings: Secondary | ICD-10-CM

## 2024-06-06 DIAGNOSIS — R140 Abdominal distension (gaseous): Secondary | ICD-10-CM

## 2024-06-06 DIAGNOSIS — Z23 Encounter for immunization: Secondary | ICD-10-CM

## 2024-06-06 DIAGNOSIS — Z0000 Encounter for general adult medical examination without abnormal findings: Secondary | ICD-10-CM

## 2024-06-06 DIAGNOSIS — E785 Hyperlipidemia, unspecified: Secondary | ICD-10-CM

## 2024-06-06 DIAGNOSIS — I10 Essential (primary) hypertension: Secondary | ICD-10-CM | POA: Diagnosis not present

## 2024-06-06 MED ORDER — LOSARTAN POTASSIUM 100 MG PO TABS: ORAL_TABLET | ORAL | 1 refills | Status: AC

## 2024-06-06 MED ORDER — HYDROCHLOROTHIAZIDE 25 MG PO TABS: 25.0000 mg | ORAL_TABLET | Freq: Every day | ORAL | 3 refills | Status: AC

## 2024-06-06 MED ORDER — MELOXICAM 15 MG PO TABS: 15.0000 mg | ORAL_TABLET | Freq: Every day | ORAL | 2 refills | Status: AC

## 2024-06-06 MED ORDER — RABEPRAZOLE SODIUM 20 MG PO TBEC: 20.0000 mg | DELAYED_RELEASE_TABLET | Freq: Every day | ORAL | 3 refills | Status: AC

## 2024-06-06 MED ORDER — PRAVASTATIN SODIUM 40 MG PO TABS: 40.0000 mg | ORAL_TABLET | Freq: Every day | ORAL | 2 refills | Status: AC
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
## 2024-12-05 ENCOUNTER — Ambulatory Visit: Admitting: Family Medicine
